# Patient Record
Sex: Female | Born: 1985 | Race: Black or African American | Hispanic: No | Smoking: Never smoker
Health system: Southern US, Community
[De-identification: ages and names within clinical notes are randomized; demographics above are authoritative.]

## PROBLEM LIST (undated history)

## (undated) ENCOUNTER — Inpatient Hospital Stay (HOSPITAL_COMMUNITY): Payer: Self-pay

## (undated) DIAGNOSIS — D649 Anemia, unspecified: Secondary | ICD-10-CM

## (undated) DIAGNOSIS — Z8759 Personal history of other complications of pregnancy, childbirth and the puerperium: Secondary | ICD-10-CM

## (undated) DIAGNOSIS — O009 Unspecified ectopic pregnancy without intrauterine pregnancy: Secondary | ICD-10-CM

## (undated) HISTORY — PX: CYSTECTOMY: SUR359

## (undated) HISTORY — DX: Personal history of other complications of pregnancy, childbirth and the puerperium: Z87.59

## (undated) HISTORY — PX: TONSILLECTOMY: SUR1361

---

## 2016-07-07 ENCOUNTER — Other Ambulatory Visit (HOSPITAL_COMMUNITY): Payer: Self-pay | Admitting: Physician Assistant

## 2016-07-07 DIAGNOSIS — Z3A13 13 weeks gestation of pregnancy: Secondary | ICD-10-CM

## 2016-07-07 DIAGNOSIS — Z3682 Encounter for antenatal screening for nuchal translucency: Secondary | ICD-10-CM

## 2016-07-08 ENCOUNTER — Encounter (HOSPITAL_COMMUNITY): Payer: Self-pay | Admitting: Physician Assistant

## 2016-07-17 ENCOUNTER — Encounter (HOSPITAL_COMMUNITY): Payer: Self-pay | Admitting: *Deleted

## 2016-07-19 ENCOUNTER — Other Ambulatory Visit (HOSPITAL_COMMUNITY): Payer: Self-pay | Admitting: Physician Assistant

## 2016-07-19 DIAGNOSIS — Z3682 Encounter for antenatal screening for nuchal translucency: Secondary | ICD-10-CM

## 2016-07-19 DIAGNOSIS — Z3A13 13 weeks gestation of pregnancy: Secondary | ICD-10-CM

## 2016-07-20 ENCOUNTER — Encounter (HOSPITAL_COMMUNITY): Payer: Self-pay

## 2016-07-20 ENCOUNTER — Ambulatory Visit (HOSPITAL_COMMUNITY): Admission: RE | Admit: 2016-07-20 | Payer: Medicaid Other | Source: Ambulatory Visit

## 2016-07-20 ENCOUNTER — Ambulatory Visit (HOSPITAL_COMMUNITY)
Admission: RE | Admit: 2016-07-20 | Discharge: 2016-07-20 | Disposition: A | Discharge status: Home / Self Care | Payer: Medicaid Other | Source: Ambulatory Visit | Attending: Physician Assistant | Admitting: Physician Assistant

## 2016-07-20 DIAGNOSIS — Z3682 Encounter for antenatal screening for nuchal translucency: Secondary | ICD-10-CM | POA: Diagnosis not present

## 2016-07-20 DIAGNOSIS — Z3A13 13 weeks gestation of pregnancy: Secondary | ICD-10-CM | POA: Insufficient documentation

## 2016-07-20 DIAGNOSIS — Z315 Encounter for genetic counseling: Secondary | ICD-10-CM | POA: Diagnosis present

## 2016-07-20 DIAGNOSIS — Z87898 Personal history of other specified conditions: Secondary | ICD-10-CM | POA: Insufficient documentation

## 2016-07-20 NOTE — Progress Notes (Signed)
Genetic Counseling  High-Risk Gestation Note  Appointment Date:  07/20/2016 Referred By: Ardyth Gal, PA-C Date of Birth:  09-Sep-1986 Partner:  Hubbard Robinson   Pregnancy History: [id number] Estimated Date of Delivery: 01/21/17 Estimated Gestational Age: [redacted]w[redacted]d Attending: Particia Nearing, MD    Mary Cortez and her partner, Mr. Hubbard Robinson, were seen for genetic counseling given family history concerns and to discuss available testing options.   In summary:  Family history reviewed  Patient reported abnormal ultrasound findings were present when her mother was pregnant with her, but the patient had no anomalies at birth  Reviewed that this reported history would not be expected to increased risk for fetal anomalies for the patient's pregnancies  Patient's father and brother have sickle cell trait; patient reported she does not have sickle cell trait  Hemoglobin electrophoresis drawn at patient's OB office and current pending  Reviewed autosomal recessive inheritance of sickle cell trait  If patient does not have sickle cell trait, pregnancy would not be at increased risk for sickle cell anemia  Screening is available to the father of the pregnancy, if desired, if patient identified to have hemoglobin variant  Discussed age related risk for fetal aneuploidy- considered low risk for aneuploidy at age 30 years old  Discussed options for screening  First screen-patient declined today  Quad screen-declined  Ultrasound- NT ultrasound performed today, within normal limits  Discussed carrier screening options  CF-OB records indicate this is pending through [facility] office  SMA-patient declined today  Hemoglobinopathies-OB records indicate this was drawn and is currently pending   Both family histories were reviewed. The patient reported that when her mother was pregnant with her, the patient's father had AFP in his blood. The patient's mother had an ultrasound at approximately  4-5 months gestation which suggested the patient's heart was developing on the outside of the body. Per the patient's mother's report, at a follow-up ultrasound during that pregnancy, the organs were visualized to be in the correct place. The patient reported that she was born without any heart issues and does not have a history of medical concerns.   In general, we reviewed the benefits and limitations of ultrasounds in pregnancy and specifically reviewed limitations of visualization of prenatal ultrasound findings in past decades. We also spent time reviewing that maternal serum AFP is used as a screening tool in pregnancy in maternal blood to assess for potential birth defects but that the measurement in paternal blood would not indicate concerns regarding a pregnancy. Given the report of the family history and the patient's personal reported medical history, the current pregnancy would not be expected to be at increased risk for fetal/congenital anomalies. Additional information regarding this history may alter recurrence risk assessment.    Additionally, Ms. Mary Cortez reported that her father and brother have sickle cell trait. She reported that she does not have sickle cell trait. Medical documentation was not available to confirm this report at this time. OB medical records indicated that hemoglobin electrophoresis was drawn and is currently pending for the patient. Prior to testing for Ms. Mary Cortez, she would have a 1 in 2 (50%) chance of also having sickle cell trait. We discussed that SCA affects the shape and function of the red blood cell by producing abnormal hemoglobin. They were counseled that hemoglobin is a protein in the RBCs that carries oxygen to the body's organs. Individuals who have SCA have a changes within the genes that codes for hemoglobin. This couple was counseled that SCA is  inherited in an autosomal recessive manner, and occurs when both copies of the hemoglobin gene are changed and  produce an abnormal hemoglobin S. Typically, one abnormal gene for the production of hemoglobin S is inherited from each parent. A carrier of SCA has one altered copy of the gene for hemoglobin and one typical working copy. Carriers of recessive conditions typically do not have symptoms related to the condition because they still have one functioning copy of the gene, and thus some production of the typical protein coded for by that gene.   OB medical records indicate that hemoglobin electrophoresis [facility], and results are currently pending. Given the patient's report, the pregnancy would not be at increased risk for sickle cell anemia. However, we discussed that if the patient is determined to have a hemoglobin variant via hemoglobin electrophoresis, screening would also be available to the father of the pregnancy. Prenatal diagnosis via amniocentesis would be available in a pregnancy where both parents were identified to be carriers of hemoglobinopathies.  We also reviewed the availability of newborn screening in [place] for hemoglobinopathies.    Without further information regarding the provided family history, an accurate genetic risk cannot be calculated. Further genetic counseling is warranted if more information is obtained.  We then discussed routine screening options. They were counseled regarding ACOG recommended screening for cystic fibrosis, spinal muscular atrophy and hemoglobinopathies.  We reviewed the autosomal recessive patterns of inheritance of these conditions, the carrier frequencies, and the availability of prenatal diagnosis if indicated. We discussed the cost of the testing, and the next steps if she were to be identified to be a carrier. In addition to hemoglobinopathies, OB medical records indicated that cystic fibrosis carrier screening was also performed and is currently pending.  She declined SMA carrier screening today.  This  couple was counseled regarding the availability of screening for fetal aneuploidy. Considering Ms. Dior Jackson's maternal age of 30 y.o. and her negative family history, we discussed that she is considered to be in the low risk category for having a fetus with aneuploidy. We reviewed chromosomes, nondisjunction, and the associated risk for fetal aneuploidy related to a maternal age of 30 y.o. at 443w4d gestation. They were counseled that the risk for aneuploidy decreases as gestational age increases, accounting for those pregnancies which spontaneously abort. We briefly discussed common fetal chromosome conditions including the features and prognoses of each.   We reviewed available routine screening options including First Screen, Quad screen, nuchal translucency ultrasound and detailed ultrasound. They were counseled that screening tests are used to modify a patient's a priori risk for aneuploidy, typically based on age. This estimate provides a pregnancy specific risk assessment. We reviewed the benefits and limitations of each option. Specifically, we discussed the conditions for which each test screens, the detection rates, and false positive rates of each. They were also briefly counseled regarding noninvasive prenatal screening (NIPS)/cell free DNA (cfDNA) screening and diagnostic testing via CVS or amniocentesis. We discussed that while NIPS and diagnostic testing should be made available to all pregnant patients, ACOG recommends that this testing be offered to those patients who are considered to have a "high" risk for fetal aneuploidy (i.e. those who are AMA, have an abnormal maternal serum or combined screening result, have an abnormal finding by fetal ultrasound, or have a family history of a specific chromosome condition). We discussed the possible results that the tests might provide including: positive, negative, unanticipated, and no result. Finally, they  were counseled regarding the cost of each  option and potential out of pocket expenses.   After consideration of all the options, they elected to have a nuchal translucency ultrasound, which was performed today. The report will be documented separately. Detailed ultrasound is available in the second trimester to the patient. They understand that screening tests cannot rule out all birth defects or genetic syndromes. The patient was advised of this limitation and states she still does not want additional testing at this time. First trimester screening and other specific screening for fetal aneuploidy were declined today.  I counseled this couple regarding the above risks and available options. The approximate face-to-face time with the genetic counselor was 35 minutes.  Quinn Plowman, MS,  Certified Genetic Counselor 07/20/2016

## 2016-08-06 ENCOUNTER — Inpatient Hospital Stay (HOSPITAL_COMMUNITY)
Admission: AD | Admit: 2016-08-06 | Discharge: 2016-08-06 | Disposition: A | Discharge status: Home / Self Care | Payer: Medicaid Other | Source: Ambulatory Visit | Attending: Obstetrics & Gynecology | Admitting: Obstetrics & Gynecology

## 2016-08-06 ENCOUNTER — Encounter (HOSPITAL_COMMUNITY): Payer: Self-pay | Admitting: *Deleted

## 2016-08-06 DIAGNOSIS — N898 Other specified noninflammatory disorders of vagina: Secondary | ICD-10-CM | POA: Diagnosis not present

## 2016-08-06 DIAGNOSIS — O26892 Other specified pregnancy related conditions, second trimester: Secondary | ICD-10-CM | POA: Insufficient documentation

## 2016-08-06 DIAGNOSIS — Z3A16 16 weeks gestation of pregnancy: Secondary | ICD-10-CM | POA: Insufficient documentation

## 2016-08-06 HISTORY — DX: Unspecified ectopic pregnancy without intrauterine pregnancy: O00.90

## 2016-08-06 LAB — WET PREP, GENITAL
Sperm: NONE SEEN
Trich, Wet Prep: NONE SEEN
Yeast Wet Prep HPF POC: NONE SEEN

## 2016-08-06 NOTE — MAU Provider Note (Signed)
Chief Complaint: Vaginal Discharge   First Provider Initiated Contact with Patient 08/06/16 2108      SUBJECTIVE HPI: Mary Cortez is a 30 y.o. G3P0020 at 3222w0d by LMP pt of [facility] who presents to maternity admissions reporting she had a single spot of pink, the size of a piece of oatmeal, when wiping tonight.  She called the nurse call line and was told to watch and if bleeding became heavier to come in but she was worried so came to MAU anyway. [place] and has prenatal care scheduled to start there right way.  She denies abdominal pain.  She has not tried any treatments, nothing makes her symptom better or worse. It is new onset, and resolved spontaneously. She denies vaginal itching/burning, urinary symptoms, h/a, dizziness, n/v, or fever/chills.     HPI  Past Medical History:  Diagnosis Date  . Ectopic pregnancy   . Medical history non-contributory    Past Surgical History:  Procedure Laterality Date  . CYSTECTOMY    . TONSILLECTOMY     Social History   Social History  . Marital status: Single    Spouse name: N/A  . Number of children: N/A  . Years of education: N/A   Occupational History  . Not on file.   Social History Main Topics  . Smoking status: Never Smoker  . Smokeless tobacco: Never Used  . Alcohol use No  . Drug use: No  . Sexual activity: Yes    Birth control/ protection: None   Other Topics Concern  . Not on file   Social History Narrative  . No narrative on file   No current facility-administered medications on file prior to encounter.    No current outpatient prescriptions on file prior to encounter.   No Known Allergies  ROS:  Review of Systems  Constitutional: Negative for chills, fatigue and fever.  Respiratory: Negative for shortness of breath.   Cardiovascular: Negative for chest pain.  Gastrointestinal: Negative for nausea and vomiting.  Genitourinary: Positive for vaginal discharge. Negative for difficulty  urinating, dysuria, flank pain, pelvic pain, vaginal bleeding and vaginal pain.  Neurological: Negative for dizziness and headaches.  Psychiatric/Behavioral: Negative.      I have reviewed patient's Past Medical Hx, Surgical Hx, Family Hx, Social Hx, medications and allergies.   Physical Exam   Patient Vitals for the past 24 hrs:  BP Temp Pulse Resp Height Weight  08/06/16 2013 - 98.8 F (37.1 C) - - - -  08/06/16 1901 122/79 99.5 F (37.5 C) 99 18 5\' 3"  (1.6 m) 189 lb (85.7 kg)   Constitutional: Well-developed, well-nourished female in no acute distress.  Cardiovascular: normal rate Respiratory: normal effort GI: Abd soft, non-tender. Pos BS x 4 MS: Extremities nontender, no edema, normal ROM Neurologic: Alert and oriented x 4.  GU: Neg CVAT.  PELVIC EXAM: Cervix pink, visually closed, without lesion, scant white creamy discharge, vaginal walls and external genitalia normal Bimanual exam: Cervix 0/long/high, firm, anterior, neg CMT, uterus nontender, nonenlarged, adnexa without tenderness, enlargement, or mass  FHT 161 by doppler  LAB RESULTS No results found for this or any previous visit (from the past 24 hour(s)). Wet prep and GCC pending    IMAGING Bedside US reveals viable IUP with FHR 155, subjectively normal fluid level, posterior placenta, above os  MAU Management/MDM: Ordered labs and reviewed results.  FHR by doppler and bedside US confirm FHR.  No evidence of bleeding on exam today.  Pt to  f/u with her transfer of prenatal care to [place] as planned.  Return to MAU as needed for emergencies.  Pt stable at time of discharge.  ASSESSMENT 1. Vaginal discharge during pregnancy in second trimester     PLAN Discharge home with second trimester precautions   Medication List    TAKE these medications   prenatal multivitamin Tabs tablet Take 1 tablet by mouth daily.      Follow-up Information    Prenatal care provider of your choice .   Why:  Make  appointment as soon as possible with prenatal care provider in [place] after your move.  Return to MAU as needed for emergencies.          Sharen Counter Certified Nurse-Midwife 08/06/2016  9:50 PM

## 2016-08-06 NOTE — MAU Note (Signed)
Pt stated when she wiped after going to the bathroom she has small piece of "tissue" come out that was pink. No bleeding or discharge and no pain or cramping.

## 2016-08-06 NOTE — Discharge Instructions (Signed)
Vaginal Bleeding During Pregnancy, Second Trimester A small amount of bleeding (spotting) from the vagina is relatively common in pregnancy. It usually stops on its own. Various things can cause bleeding or spotting in pregnancy. Some bleeding may be related to the pregnancy, and some may not. Sometimes the bleeding is normal and is not a problem. However, bleeding can also be a sign of something serious. Be sure to tell your health care provider about any vaginal bleeding right away. Some possible causes of vaginal bleeding during the second trimester include:  Infection, inflammation, or growths on the cervix.   The placenta may be partially or completely covering the opening of the cervix inside the uterus (placenta previa).  The placenta may have separated from the uterus (abruption of the placenta).   You may be having early (preterm) labor.   The cervix may not be strong enough to keep a baby inside the uterus (cervical insufficiency).   Tiny cysts may have developed in the uterus instead of pregnancy tissue (molar pregnancy). HOME CARE INSTRUCTIONS  Watch your condition for any changes. The following actions may help to lessen any discomfort you are feeling:  Follow your health care provider's instructions for limiting your activity. If your health care provider orders bed rest, you may need to stay in bed and only get up to use the bathroom. However, your health care provider may allow you to continue light activity.  If needed, make plans for someone to help with your regular activities and responsibilities while you are on bed rest.  Keep track of the number of pads you use each day, how often you change pads, and how soaked (saturated) they are. Write this down.  Do not use tampons. Do not douche.  Do not have sexual intercourse or orgasms until approved by your health care provider.  If you pass any tissue from your vagina, save the tissue so you can show it to your  health care provider.  Only take over-the-counter or prescription medicines as directed by your health care provider.  Do not take aspirin because it can make you bleed.  Do not exercise or perform any strenuous activities or heavy lifting without your health care provider's permission.  Keep all follow-up appointments as directed by your health care provider. SEEK MEDICAL CARE IF:  You have any vaginal bleeding during any part of your pregnancy.  You have cramps or labor pains.  You have a fever, not controlled by medicine. SEEK IMMEDIATE MEDICAL CARE IF:   You have severe cramps in your back or belly (abdomen).  You have contractions.  You have chills.  You pass large clots or tissue from your vagina.  Your bleeding increases.  You feel light-headed or weak, or you have fainting episodes.  You are leaking fluid or have a gush of fluid from your vagina. MAKE SURE YOU:  Understand these instructions.  Will watch your condition.  Will get help right away if you are not doing well or get worse.   This information is not intended to replace advice given to you by your health care provider. Make sure you discuss any questions you have with your health care provider.   Document Released: 07/15/2005 Document Revised: 10/10/2013 Document Reviewed: 06/12/2013 Elsevier Interactive Patient Education 2016 [employer]  

## 2016-08-07 LAB — GC/CHLAMYDIA PROBE AMP (~~LOC~~) NOT AT ARMC
Chlamydia: NEGATIVE
Neisseria Gonorrhea: NEGATIVE

## 2016-08-17 ENCOUNTER — Ambulatory Visit (HOSPITAL_COMMUNITY): Payer: Medicaid Other

## 2017-05-22 ENCOUNTER — Encounter (HOSPITAL_COMMUNITY): Payer: Self-pay

## 2018-12-23 ENCOUNTER — Other Ambulatory Visit: Payer: Self-pay

## 2018-12-23 ENCOUNTER — Inpatient Hospital Stay (HOSPITAL_COMMUNITY): Payer: BLUE CROSS/BLUE SHIELD | Admitting: Anesthesiology

## 2018-12-23 ENCOUNTER — Inpatient Hospital Stay (HOSPITAL_COMMUNITY): Payer: BLUE CROSS/BLUE SHIELD

## 2018-12-23 ENCOUNTER — Encounter (HOSPITAL_COMMUNITY): Payer: Self-pay | Admitting: *Deleted

## 2018-12-23 ENCOUNTER — Encounter (HOSPITAL_COMMUNITY)
Admission: AD | Disposition: A | Discharge status: Home / Self Care | Payer: Self-pay | Source: Home / Self Care | Attending: Obstetrics and Gynecology

## 2018-12-23 ENCOUNTER — Observation Stay (HOSPITAL_COMMUNITY)
Admission: AD | Admit: 2018-12-23 | Discharge: 2018-12-25 | Disposition: A | Discharge status: Home / Self Care | Payer: BLUE CROSS/BLUE SHIELD | Attending: Obstetrics and Gynecology | Admitting: Obstetrics and Gynecology

## 2018-12-23 DIAGNOSIS — O008 Other ectopic pregnancy without intrauterine pregnancy: Secondary | ICD-10-CM | POA: Diagnosis present

## 2018-12-23 DIAGNOSIS — O021 Missed abortion: Secondary | ICD-10-CM

## 2018-12-23 DIAGNOSIS — Z419 Encounter for procedure for purposes other than remedying health state, unspecified: Secondary | ICD-10-CM

## 2018-12-23 DIAGNOSIS — Z8759 Personal history of other complications of pregnancy, childbirth and the puerperium: Secondary | ICD-10-CM

## 2018-12-23 DIAGNOSIS — Z9889 Other specified postprocedural states: Secondary | ICD-10-CM

## 2018-12-23 DIAGNOSIS — O209 Hemorrhage in early pregnancy, unspecified: Secondary | ICD-10-CM

## 2018-12-23 DIAGNOSIS — Z3A08 8 weeks gestation of pregnancy: Secondary | ICD-10-CM | POA: Insufficient documentation

## 2018-12-23 HISTORY — PX: LAPAROTOMY: SHX154

## 2018-12-23 HISTORY — DX: Anemia, unspecified: D64.9

## 2018-12-23 HISTORY — DX: Personal history of other complications of pregnancy, childbirth and the puerperium: Z87.59

## 2018-12-23 HISTORY — PX: DIAGNOSTIC LAPAROSCOPY WITH REMOVAL OF ECTOPIC PREGNANCY: SHX6449

## 2018-12-23 LAB — CBC
HCT: 37.6 % (ref 36.0–46.0)
HCT: 34.3 % — ABNORMAL LOW (ref 36.0–46.0)
Hemoglobin: 12 g/dL (ref 12.0–15.0)
Hemoglobin: 11.6 g/dL — ABNORMAL LOW (ref 12.0–15.0)
MCH: 24.1 pg — ABNORMAL LOW (ref 26.0–34.0)
MCH: 26.7 pg (ref 26.0–34.0)
MCHC: 31.9 g/dL (ref 30.0–36.0)
MCHC: 33.8 g/dL (ref 30.0–36.0)
MCV: 75.5 fL — ABNORMAL LOW (ref 80.0–100.0)
MCV: 79 fL — ABNORMAL LOW (ref 80.0–100.0)
Platelets: 238 10*3/uL (ref 150–400)
Platelets: 215 10*3/uL (ref 150–400)
RBC: 4.98 MIL/uL (ref 3.87–5.11)
RBC: 4.34 MIL/uL (ref 3.87–5.11)
RDW: 14.5 % (ref 11.5–15.5)
RDW: 15.1 % (ref 11.5–15.5)
WBC: 4.9 10*3/uL (ref 4.0–10.5)
WBC: 17.1 10*3/uL — ABNORMAL HIGH (ref 4.0–10.5)
nRBC: 0 % (ref 0.0–0.2)
nRBC: 0 % (ref 0.0–0.2)

## 2018-12-23 LAB — URINALYSIS, ROUTINE W REFLEX MICROSCOPIC
Bilirubin Urine: NEGATIVE
Glucose, UA: NEGATIVE mg/dL
Ketones, ur: 5 mg/dL — AB
Leukocytes,Ua: NEGATIVE
Nitrite: NEGATIVE
Protein, ur: NEGATIVE mg/dL
RBC / HPF: >50 RBC/hpf — ABNORMAL HIGH (ref 0–5)
Specific Gravity, Urine: 1.025 (ref 1.005–1.030)
pH: 6 (ref 5.0–8.0)

## 2018-12-23 LAB — WET PREP, GENITAL
Clue Cells Wet Prep HPF POC: NONE SEEN
Sperm: NONE SEEN
Trich, Wet Prep: NONE SEEN
Yeast Wet Prep HPF POC: NONE SEEN

## 2018-12-23 LAB — PREPARE RBC (CROSSMATCH)

## 2018-12-23 LAB — POCT PREGNANCY, URINE: Preg Test, Ur: POSITIVE — AB

## 2018-12-23 LAB — HCG, QUANTITATIVE, PREGNANCY: hCG, Beta Chain, Quant, S: 23812 m[IU]/mL — ABNORMAL HIGH (ref <5)

## 2018-12-23 SURGERY — LAPAROSCOPY, WITH ECTOPIC PREGNANCY SURGICAL TREATMENT
Anesthesia: General | Site: Abdomen

## 2018-12-23 MED ORDER — DIPHENHYDRAMINE HCL 50 MG/ML IJ SOLN: 12.5000 mg | Freq: Four times a day (QID) | INTRAMUSCULAR | Status: DC | PRN

## 2018-12-23 MED ORDER — SODIUM CHLORIDE (PF) 0.9 % IJ SOLN
INTRAMUSCULAR | Status: AC
  Filled 2018-12-23: qty 100

## 2018-12-23 MED ORDER — ONDANSETRON HCL 4 MG/2ML IJ SOLN
INTRAMUSCULAR | Status: AC
  Filled 2018-12-23: qty 4

## 2018-12-23 MED ORDER — LACTATED RINGERS IV SOLN
INTRAVENOUS | Status: DC
  Administered 2018-12-23 (×3): via INTRAVENOUS

## 2018-12-23 MED ORDER — BUPIVACAINE HCL 0.5 % IJ SOLN
INTRAMUSCULAR | Status: AC
  Filled 2018-12-23: qty 1

## 2018-12-23 MED ORDER — POLYETHYLENE GLYCOL 3350 17 G PO PACK
17.0000 g | PACK | Freq: Every day | ORAL | Status: DC
  Administered 2018-12-24: 17 g via ORAL
  Filled 2018-12-23: qty 1

## 2018-12-23 MED ORDER — CALCIUM CHLORIDE 10 % IV SOLN
INTRAVENOUS | Status: DC | PRN
  Administered 2018-12-23 (×5): 200 mg via INTRAVENOUS

## 2018-12-23 MED ORDER — NALOXONE HCL 0.4 MG/ML IJ SOLN: 0.4000 mg | INTRAMUSCULAR | Status: DC | PRN

## 2018-12-23 MED ORDER — BUPIVACAINE HCL 0.5 % IJ SOLN
INTRAMUSCULAR | Status: DC | PRN
  Administered 2018-12-23: 9 mL

## 2018-12-23 MED ORDER — LACTATED RINGERS IV SOLN
INTRAVENOUS | Status: DC
  Administered 2018-12-23: 22:00:00 via INTRAVENOUS

## 2018-12-23 MED ORDER — LIDOCAINE 2% (20 MG/ML) 5 ML SYRINGE
INTRAMUSCULAR | Status: DC | PRN
  Administered 2018-12-23: 60 mg via INTRAVENOUS

## 2018-12-23 MED ORDER — SODIUM CHLORIDE 0.9 % IV SOLN
INTRAVENOUS | Status: DC | PRN
  Administered 2018-12-23: 17:00:00 via INTRAVENOUS

## 2018-12-23 MED ORDER — ACETAMINOPHEN 10 MG/ML IV SOLN
INTRAVENOUS | Status: AC
  Filled 2018-12-23: qty 100

## 2018-12-23 MED ORDER — MIDAZOLAM HCL 2 MG/2ML IJ SOLN
INTRAMUSCULAR | Status: AC
  Filled 2018-12-23: qty 2

## 2018-12-23 MED ORDER — SILVER NITRATE-POT NITRATE 75-25 % EX MISC
CUTANEOUS | Status: DC | PRN
  Administered 2018-12-23: 4

## 2018-12-23 MED ORDER — ACETAMINOPHEN 10 MG/ML IV SOLN
INTRAVENOUS | Status: DC | PRN
  Administered 2018-12-23: 1000 mg via INTRAVENOUS

## 2018-12-23 MED ORDER — SODIUM CHLORIDE 0.9% IV SOLUTION: Freq: Once | INTRAVENOUS | Status: DC

## 2018-12-23 MED ORDER — SODIUM CHLORIDE 0.9 % IV SOLN: 10.0000 mL/h | Freq: Once | INTRAVENOUS | Status: DC

## 2018-12-23 MED ORDER — ROCURONIUM BROMIDE 50 MG/5ML IV SOSY
PREFILLED_SYRINGE | INTRAVENOUS | Status: AC
  Filled 2018-12-23: qty 10

## 2018-12-23 MED ORDER — SODIUM CHLORIDE 0.9% FLUSH: 9.0000 mL | INTRAVENOUS | Status: DC | PRN

## 2018-12-23 MED ORDER — RHO D IMMUNE GLOBULIN 1500 UNIT/2ML IJ SOSY
300.0000 ug | PREFILLED_SYRINGE | Freq: Once | INTRAMUSCULAR | Status: AC
  Administered 2018-12-23: 300 ug via INTRAMUSCULAR
  Filled 2018-12-23: qty 2

## 2018-12-23 MED ORDER — SUGAMMADEX SODIUM 200 MG/2ML IV SOLN
INTRAVENOUS | Status: DC | PRN
  Administered 2018-12-23: 191 mg via INTRAVENOUS

## 2018-12-23 MED ORDER — ROCURONIUM BROMIDE 50 MG/5ML IV SOSY
PREFILLED_SYRINGE | INTRAVENOUS | Status: DC | PRN
  Administered 2018-12-23: 10 mg via INTRAVENOUS
  Administered 2018-12-23: 20 mg via INTRAVENOUS
  Administered 2018-12-23: 30 mg via INTRAVENOUS
  Administered 2018-12-23: 10 mg via INTRAVENOUS

## 2018-12-23 MED ORDER — ONDANSETRON HCL 4 MG PO TABS: 4.0000 mg | ORAL_TABLET | Freq: Four times a day (QID) | ORAL | Status: DC | PRN

## 2018-12-23 MED ORDER — PROPOFOL 10 MG/ML IV BOLUS
INTRAVENOUS | Status: DC | PRN
  Administered 2018-12-23: 130 mg via INTRAVENOUS

## 2018-12-23 MED ORDER — SUCCINYLCHOLINE CHLORIDE 200 MG/10ML IV SOSY
PREFILLED_SYRINGE | INTRAVENOUS | Status: DC | PRN
  Administered 2018-12-23: 100 mg via INTRAVENOUS

## 2018-12-23 MED ORDER — SIMETHICONE 80 MG PO CHEW
80.0000 mg | CHEWABLE_TABLET | Freq: Four times a day (QID) | ORAL | Status: DC
  Administered 2018-12-24 (×2): 80 mg via ORAL
  Filled 2018-12-23 (×5): qty 1

## 2018-12-23 MED ORDER — VASOPRESSIN 20 UNIT/ML IV SOLN
INTRAVENOUS | Status: AC
  Filled 2018-12-23: qty 1

## 2018-12-23 MED ORDER — FENTANYL CITRATE (PF) 250 MCG/5ML IJ SOLN
INTRAMUSCULAR | Status: DC | PRN
  Administered 2018-12-23 (×2): 50 ug via INTRAVENOUS
  Administered 2018-12-23 (×4): 100 ug via INTRAVENOUS

## 2018-12-23 MED ORDER — DEXAMETHASONE SODIUM PHOSPHATE 10 MG/ML IJ SOLN
INTRAMUSCULAR | Status: DC | PRN
  Administered 2018-12-23: 10 mg via INTRAVENOUS

## 2018-12-23 MED ORDER — CALCIUM CHLORIDE 10 % IV SOLN
INTRAVENOUS | Status: AC
  Filled 2018-12-23: qty 10

## 2018-12-23 MED ORDER — DEXAMETHASONE SODIUM PHOSPHATE 10 MG/ML IJ SOLN
INTRAMUSCULAR | Status: AC
  Filled 2018-12-23: qty 1

## 2018-12-23 MED ORDER — HYDROMORPHONE HCL 1 MG/ML IJ SOLN
INTRAMUSCULAR | Status: AC
  Filled 2018-12-23: qty 2

## 2018-12-23 MED ORDER — HYDROMORPHONE HCL 1 MG/ML IJ SOLN
INTRAMUSCULAR | Status: AC
  Filled 2018-12-23: qty 0.5

## 2018-12-23 MED ORDER — SODIUM CHLORIDE 0.9 % IV SOLN
INTRAVENOUS | Status: DC | PRN
  Administered 2018-12-23: 100 ug/min via INTRAVENOUS

## 2018-12-23 MED ORDER — PANTOPRAZOLE SODIUM 40 MG IV SOLR: 40.0000 mg | Freq: Every day | INTRAVENOUS | Status: DC

## 2018-12-23 MED ORDER — DIPHENHYDRAMINE HCL 12.5 MG/5ML PO ELIX
12.5000 mg | ORAL_SOLUTION | Freq: Four times a day (QID) | ORAL | Status: DC | PRN
  Filled 2018-12-23: qty 10

## 2018-12-23 MED ORDER — MIDAZOLAM HCL 5 MG/5ML IJ SOLN
INTRAMUSCULAR | Status: DC | PRN
  Administered 2018-12-23 (×2): 2 mg via INTRAVENOUS

## 2018-12-23 MED ORDER — PHENYLEPHRINE 40 MCG/ML (10ML) SYRINGE FOR IV PUSH (FOR BLOOD PRESSURE SUPPORT)
PREFILLED_SYRINGE | INTRAVENOUS | Status: DC | PRN
  Administered 2018-12-23: 80 ug via INTRAVENOUS
  Administered 2018-12-23 (×2): 120 ug via INTRAVENOUS
  Administered 2018-12-23: 80 ug via INTRAVENOUS

## 2018-12-23 MED ORDER — FENTANYL CITRATE (PF) 250 MCG/5ML IJ SOLN
INTRAMUSCULAR | Status: AC
  Filled 2018-12-23: qty 5

## 2018-12-23 MED ORDER — MENTHOL 3 MG MT LOZG
1.0000 | LOZENGE | OROMUCOSAL | Status: DC | PRN
  Administered 2018-12-24: 3 mg via ORAL
  Filled 2018-12-23 (×2): qty 9

## 2018-12-23 MED ORDER — ALBUMIN HUMAN 5 % IV SOLN
INTRAVENOUS | Status: DC | PRN
  Administered 2018-12-23 (×2): via INTRAVENOUS

## 2018-12-23 MED ORDER — HYDROMORPHONE HCL 1 MG/ML IJ SOLN
0.2500 mg | INTRAMUSCULAR | Status: DC | PRN
  Administered 2018-12-23 (×4): 0.5 mg via INTRAVENOUS

## 2018-12-23 MED ORDER — ONDANSETRON HCL 4 MG/2ML IJ SOLN: 4.0000 mg | Freq: Four times a day (QID) | INTRAMUSCULAR | Status: DC | PRN

## 2018-12-23 MED ORDER — LIDOCAINE 2% (20 MG/ML) 5 ML SYRINGE
INTRAMUSCULAR | Status: AC
  Filled 2018-12-23: qty 5

## 2018-12-23 MED ORDER — HYDROMORPHONE HCL 1 MG/ML IJ SOLN
INTRAMUSCULAR | Status: DC | PRN
  Administered 2018-12-23: 0.5 mg via INTRAVENOUS

## 2018-12-23 MED ORDER — PROPOFOL 10 MG/ML IV BOLUS
INTRAVENOUS | Status: AC
  Filled 2018-12-23: qty 40

## 2018-12-23 MED ORDER — ONDANSETRON HCL 4 MG/2ML IJ SOLN
INTRAMUSCULAR | Status: DC | PRN
  Administered 2018-12-23 (×2): 4 mg via INTRAVENOUS

## 2018-12-23 MED ORDER — SILVER NITRATE-POT NITRATE 75-25 % EX MISC
CUTANEOUS | Status: AC
  Filled 2018-12-23: qty 1

## 2018-12-23 MED ORDER — HYDROMORPHONE 1 MG/ML IV SOLN
INTRAVENOUS | Status: DC
  Administered 2018-12-23: 25 mg via INTRAVENOUS
  Administered 2018-12-24: 0.9 mg via INTRAVENOUS
  Filled 2018-12-23: qty 25

## 2018-12-23 SURGICAL SUPPLY — 58 items
APPLICATOR COTTON TIP 6 STRL (MISCELLANEOUS) ×1 IMPLANT
APPLICATOR COTTON TIP 6IN STRL (MISCELLANEOUS) ×2
BARRIER ADHS 3X4 INTERCEED (GAUZE/BANDAGES/DRESSINGS) ×2 IMPLANT
BENZOIN TINCTURE PRP APPL 2/3 (GAUZE/BANDAGES/DRESSINGS) ×2 IMPLANT
BLADE SURG 15 STRL LF DISP TIS (BLADE) ×1 IMPLANT
BLADE SURG 15 STRL SS (BLADE) ×1
CABLE HIGH FREQUENCY MONO STRZ (ELECTRODE) ×2 IMPLANT
DEFOGGER SCOPE WARMER CLEARIFY (MISCELLANEOUS) ×2 IMPLANT
DERMABOND ADVANCED (GAUZE/BANDAGES/DRESSINGS) ×2
DERMABOND ADVANCED .7 DNX12 (GAUZE/BANDAGES/DRESSINGS) ×2 IMPLANT
DRSG OPSITE POSTOP 3X4 (GAUZE/BANDAGES/DRESSINGS) ×2 IMPLANT
DRSG OPSITE POSTOP 4X10 (GAUZE/BANDAGES/DRESSINGS) ×2 IMPLANT
DURAPREP 26ML APPLICATOR (WOUND CARE) ×2 IMPLANT
ELECT REM PT RETURN 9FT ADLT (ELECTROSURGICAL) ×2
ELECTRODE REM PT RTRN 9FT ADLT (ELECTROSURGICAL) ×1 IMPLANT
GLOVE BIOGEL PI IND STRL 7.0 (GLOVE) ×2 IMPLANT
GLOVE BIOGEL PI IND STRL 7.5 (GLOVE) ×2 IMPLANT
GLOVE BIOGEL PI INDICATOR 7.0 (GLOVE) ×2
GLOVE BIOGEL PI INDICATOR 7.5 (GLOVE) ×2
GLOVE SURG SS PI 7.0 STRL IVOR (GLOVE) ×2 IMPLANT
GOWN STRL REUS W/ TWL LRG LVL3 (GOWN DISPOSABLE) ×3 IMPLANT
GOWN STRL REUS W/TWL LRG LVL3 (GOWN DISPOSABLE) ×3
KIT TURNOVER KIT B (KITS) ×2 IMPLANT
LIGASURE VESSEL 5MM BLUNT TIP (ELECTROSURGICAL) IMPLANT
NEEDLE EPID 17G 5 ECHO TUOHY (NEEDLE) ×2 IMPLANT
NS IRRIG 1000ML POUR BTL (IV SOLUTION) ×2 IMPLANT
PACK LAPAROSCOPY BASIN (CUSTOM PROCEDURE TRAY) ×2 IMPLANT
PACK TRENDGUARD 450 HYBRID PRO (MISCELLANEOUS) ×1 IMPLANT
PAD OB MATERNITY 4.3X12.25 (PERSONAL CARE ITEMS) ×2 IMPLANT
PENCIL BUTTON HOLSTER BLD 10FT (ELECTRODE) ×2 IMPLANT
POUCH LAPAROSCOPIC INSTRUMENT (MISCELLANEOUS) ×2 IMPLANT
POUCH SPECIMEN RETRIEVAL 10MM (ENDOMECHANICALS) ×2 IMPLANT
PROTECTOR NERVE ULNAR (MISCELLANEOUS) ×4 IMPLANT
RETRACTOR WND ALEXIS 25 LRG (MISCELLANEOUS) ×1 IMPLANT
RTRCTR WOUND ALEXIS 25CM LRG (MISCELLANEOUS) ×2
SCISSORS LAP 5X35 DISP (ENDOMECHANICALS) ×2 IMPLANT
SET IRRIG TUBING LAPAROSCOPIC (IRRIGATION / IRRIGATOR) ×2 IMPLANT
SET TUBE SMOKE EVAC HIGH FLOW (TUBING) ×2 IMPLANT
SLEEVE ADV FIXATION 5X100MM (TROCAR) IMPLANT
SLEEVE ENDOPATH XCEL 5M (ENDOMECHANICALS) IMPLANT
SPONGE LAP 18X18 RF (DISPOSABLE) ×8 IMPLANT
STRIP CLOSURE SKIN 1/2X4 (GAUZE/BANDAGES/DRESSINGS) ×2 IMPLANT
SUCTION POOLE TIP (SUCTIONS) ×2 IMPLANT
SUT MNCRL AB 0 CT1 27 (SUTURE) ×2 IMPLANT
SUT MON AB 2-0 CT1 27 (SUTURE) ×4 IMPLANT
SUT MON AB 2-0 CT1 36 (SUTURE) ×4 IMPLANT
SUT MON AB 4-0 PS1 27 (SUTURE) ×2 IMPLANT
SUT VIC AB 2-0 CT1 27 (SUTURE) ×1
SUT VIC AB 2-0 CT1 TAPERPNT 27 (SUTURE) ×1 IMPLANT
SUT VICRYL 0 UR6 27IN ABS (SUTURE) ×2 IMPLANT
SYR 10ML LL (SYRINGE) ×2 IMPLANT
TOWEL OR 17X24 6PK STRL BLUE (TOWEL DISPOSABLE) ×4 IMPLANT
TRAY FOLEY W/BAG SLVR 14FR (SET/KITS/TRAYS/PACK) ×2 IMPLANT
TRENDGUARD 450 HYBRID PRO PACK (MISCELLANEOUS) ×2
TROCAR ADV FIXATION 5X100MM (TROCAR) ×2 IMPLANT
TROCAR BALLN 12MMX100 BLUNT (TROCAR) ×2 IMPLANT
TUBE CONNECTING 12X1/4 (SUCTIONS) ×2 IMPLANT
YANKAUER SUCT BULB TIP NO VENT (SUCTIONS) ×2 IMPLANT

## 2018-12-23 NOTE — Anesthesia Postprocedure Evaluation (Incomplete)
Anesthesia Post Note  Patient: Mary Cortez  Procedure(s) Performed: DIAGNOSTIC LAPAROSCOPY WITH REMOVAL OF ECTOPIC PREGNANCY (N/A Abdomen) Exploratory Laparotomy for removal of right corneal ectopic pregnancy (N/A Abdomen)     Anesthesia Post Evaluation  Last Vitals:  Vitals:   12/23/18 0947 12/23/18 1445  BP: 134/82 118/72  Pulse: (!) 101 (!) 103  Resp: 17 18  Temp: 37.1 C 37.3 C  SpO2: 100% 100%    Last Pain:  Vitals:   12/23/18 1445  TempSrc: Axillary  PainSc:                  Dairl Ponder

## 2018-12-23 NOTE — MAU Note (Addendum)
Has been spotting off and on for 11 days.  Has been nauseated.  +HPT yesterday, faintly positive.  Hx of ectopic.

## 2018-12-23 NOTE — Anesthesia Procedure Notes (Signed)
Procedure Name: Intubation Date/Time: 12/23/2018 4:14 PM Performed by: Adria Dill, CRNA Pre-anesthesia Checklist: Patient identified, Emergency Drugs available, Suction available and Patient being monitored Patient Re-evaluated:Patient Re-evaluated prior to induction Oxygen Delivery Method: Circle system utilized Preoxygenation: Pre-oxygenation with 100% oxygen Induction Type: IV induction, Rapid sequence and Cricoid Pressure applied Laryngoscope Size: Miller and 2 Grade View: Grade I Tube type: Oral Airway Equipment and Method: Stylet Placement Confirmation: ETT inserted through vocal cords under direct vision,  positive ETCO2 and breath sounds checked- equal and bilateral Secured at: 21 cm Tube secured with: Tape Dental Injury: Teeth and Oropharynx as per pre-operative assessment

## 2018-12-23 NOTE — MAU Provider Note (Signed)
Chief Complaint: Possible Pregnancy; Nausea; Vaginal Bleeding; and Abdominal Pain   First Provider Initiated Contact with Patient 12/23/18 1028      SUBJECTIVE HPI: Mary Cortez is a 33 y.o. G4P1020 at [redacted]w[redacted]d by LMP who presents to maternity admissions reporting spotting off and on x 11 days with positive HPT yesterday.  LMP was January 14 with hx of regular periods.  Pt has hx of ectopic pregnancy so wanted to be evaluated. The bleeding is light, pink and needs a pantyliner only. There is some nausea daily but none now. There are no other symptoms. She has not tried any treatments.  She denies vaginal bleeding, vaginal itching/burning, urinary symptoms, h/a, dizziness, n/v, or fever/chills.     HPI  Past Medical History:  Diagnosis Date  . Anemia   . Ectopic pregnancy   . Medical history non-contributory    Past Surgical History:  Procedure Laterality Date  . CESAREAN SECTION    . CYSTECTOMY    . TONSILLECTOMY     Social History   Socioeconomic History  . Marital status: Significant Other    Spouse name: Not on file  . Number of children: Not on file  . Years of education: Not on file  . Highest education level: Not on file  Occupational History  . Not on file  Social Needs  . Financial resource strain: Not on file  . Food insecurity:    Worry: Not on file    Inability: Not on file  . Transportation needs:    Medical: Not on file    Non-medical: Not on file  Tobacco Use  . Smoking status: Never Smoker  . Smokeless tobacco: Never Used  Substance and Sexual Activity  . Alcohol use: No  . Drug use: No  . Sexual activity: Yes    Birth control/protection: None  Lifestyle  . Physical activity:    Days per week: Not on file    Minutes per session: Not on file  . Stress: Not on file  Relationships  . Social connections:    Talks on phone: Not on file    Gets together: Not on file    Attends religious service: Not on file    Active member of club or organization:  Not on file    Attends meetings of clubs or organizations: Not on file    Relationship status: Not on file  . Intimate partner violence:    Fear of current or ex partner: Not on file    Emotionally abused: Not on file    Physically abused: Not on file    Forced sexual activity: Not on file  Other Topics Concern  . Not on file  Social History Narrative  . Not on file   No current facility-administered medications on file prior to encounter.    Current Outpatient Medications on File Prior to Encounter  Medication Sig Dispense Refill  . Prenatal Vit-Fe Fumarate-FA (PRENATAL MULTIVITAMIN) TABS tablet Take 1 tablet by mouth daily.     No Known Allergies  ROS:  Review of Systems  Constitutional: Negative for chills, fatigue and fever.  Respiratory: Negative for shortness of breath.   Cardiovascular: Negative for chest pain.  Gastrointestinal: Positive for nausea. Negative for abdominal pain and vomiting.  Genitourinary: Positive for vaginal bleeding. Negative for difficulty urinating, dysuria, flank pain, pelvic pain, vaginal discharge and vaginal pain.  Neurological: Negative for dizziness and headaches.  Psychiatric/Behavioral: Negative.      I have reviewed patient's Past Medical Hx,  Surgical Hx, Family Hx, Social Hx, medications and allergies.   Physical Exam   Patient Vitals for the past 24 hrs:  BP Temp Temp src Pulse Resp SpO2 Height Weight  12/23/18 0947 134/82 98.7 F (37.1 C) Oral (!) 101 17 100 % 5\' 3"  (1.6 m) 95.5 kg   Constitutional: Well-developed, well-nourished female in no acute distress.  Cardiovascular: normal rate Respiratory: normal effort GI: Abd soft, non-tender. Pos BS x 4 MS: Extremities nontender, no edema, normal ROM Neurologic: Alert and oriented x 4.  GU: Neg CVAT.  PELVIC EXAM: Cervix pink, visually closed, without lesion, small amount dark red bleeding, no fox swab required to visualize cervix, vaginal walls and external genitalia  normal Bimanual exam: Cervix 0/long/high, firm, anterior, neg CMT, uterus nontender, nonenlarged, adnexa without tenderness, enlargement, or mass   LAB RESULTS Results for orders placed or performed during the hospital encounter of 12/23/18 (from the past 24 hour(s))  Urinalysis, Routine w reflex microscopic     Status: Abnormal   Collection Time: 12/23/18 10:08 AM  Result Value Ref Range   Color, Urine YELLOW YELLOW   APPearance HAZY (A) CLEAR   Specific Gravity, Urine 1.025 1.005 - 1.030   pH 6.0 5.0 - 8.0   Glucose, UA NEGATIVE NEGATIVE mg/dL   Hgb urine dipstick LARGE (A) NEGATIVE   Bilirubin Urine NEGATIVE NEGATIVE   Ketones, ur 5 (A) NEGATIVE mg/dL   Protein, ur NEGATIVE NEGATIVE mg/dL   Nitrite NEGATIVE NEGATIVE   Leukocytes,Ua NEGATIVE NEGATIVE   RBC / HPF >50 (H) 0 - 5 RBC/hpf   WBC, UA 0-5 0 - 5 WBC/hpf   Bacteria, UA FEW (A) NONE SEEN   Squamous Epithelial / LPF 0-5 0 - 5   Mucus PRESENT   Pregnancy, urine POC     Status: Abnormal   Collection Time: 12/23/18 10:09 AM  Result Value Ref Range   Preg Test, Ur POSITIVE (A) NEGATIVE  Wet prep, genital     Status: Abnormal   Collection Time: 12/23/18 10:37 AM  Result Value Ref Range   Yeast Wet Prep HPF POC NONE SEEN NONE SEEN   Trich, Wet Prep NONE SEEN NONE SEEN   Clue Cells Wet Prep HPF POC NONE SEEN NONE SEEN   WBC, Wet Prep HPF POC MODERATE (A) NONE SEEN   Sperm NONE SEEN   CBC     Status: Abnormal   Collection Time: 12/23/18 10:52 AM  Result Value Ref Range   WBC 4.9 4.0 - 10.5 K/uL   RBC 4.98 3.87 - 5.11 MIL/uL   Hemoglobin 12.0 12.0 - 15.0 g/dL   HCT 00.3 79.4 - 44.6 %   MCV 75.5 (L) 80.0 - 100.0 fL   MCH 24.1 (L) 26.0 - 34.0 pg   MCHC 31.9 30.0 - 36.0 g/dL   RDW 19.0 12.2 - 24.1 %   Platelets 238 150 - 400 K/uL   nRBC 0.0 0.0 - 0.2 %  hCG, quantitative, pregnancy     Status: Abnormal   Collection Time: 12/23/18 10:52 AM  Result Value Ref Range   hCG, Beta Chain, Quant, S 23,812 (H) <5 mIU/mL  ABO/Rh      Status: None   Collection Time: 12/23/18 11:23 AM  Result Value Ref Range   ABO/RH(D)      B NEG Performed at [facility], [address] [place], [place] [address]     --/--/B NEG Performed at [facility], [address], [place], [place] [address]  (  03/06 1123)  IMAGING US Ob Less Than 14 Weeks With Ob Transvaginal  Result Date: 12/23/2018 CLINICAL DATA:  Vaginal bleeding in 1st trimester pregnancy. Previous ectopic pregnancy. Gestational age by LMP of 7 weeks 3 days. EXAM: OBSTETRIC <14 WK [place] AND TRANSVAGINAL OB US TECHNIQUE: Both transabdominal and transvaginal ultrasound examinations were performed for complete evaluation of the gestation as well as the maternal uterus, adnexal regions, and pelvic cul-de-sac. Transvaginal technique was performed to assess early pregnancy. COMPARISON:  None. FINDINGS: Intrauterine gestational sac: Located in right uterine cornual region, and appears to be partially surrounded by myometrium Yolk sac:  Not Visualized. Embryo:  Visualized. Cardiac Activity: None CRL:  18 mm   8 w   2 d Subchorionic hemorrhage:  None visualized. Maternal uterus/adnexae: Both ovaries are normal in appearance. No evidence free fluid. IMPRESSION: Failed 8 week pregnancy located in the right cornual region of the uterus, highly suspicious for cornual ectopic pregnancy. No evidence of hemoperitoneum. Critical Value/emergent results were called by telephone at the time of interpretation on 12/23/2018 at 11:57 am to Karcyn Menn LEFTWICH-KIRBY , who verbally acknowledged these results. Electronically Signed   By: Myles Rosenthal M.D.   On: 12/23/2018 11:59    MAU Management/MDM: Orders Placed This Encounter  Procedures  . Wet prep, genital  . US OB LESS THAN 14 WEEKS WITH OB TRANSVAGINAL  . Urinalysis, Routine w reflex microscopic  . CBC  . hCG, quantitative, pregnancy  . HIV Antibody (routine testing w rflx)  . Pregnancy, urine POC  . ABO/Rh  . Rh IG workup (includes  ABO/Rh)    No orders of the defined types were placed in this encounter.   [place] indicates 8 week demise, and likely cornual ectopic.  Pt stable, denies pain.  Rh negative, rhogam workup ordered.  Consult Dr Langston Masker who is in delivery and will return call. Report to Gerrit Heck, CNM.   Sharen Counter Certified Nurse-Midwife 12/23/2018  12:41 PM

## 2018-12-23 NOTE — Transfer of Care (Signed)
Immediate Anesthesia Transfer of Care Note  Patient: Mary Cortez  Procedure(s) Performed: DIAGNOSTIC LAPAROSCOPY WITH REMOVAL OF ECTOPIC PREGNANCY (N/A Abdomen) Exploratory Laparotomy for removal of right corneal ectopic pregnancy (N/A Abdomen)  Patient Location: PACU  Anesthesia Type:General  Level of Consciousness: awake, alert  and patient cooperative  Airway & Oxygen Therapy: Patient Spontanous Breathing and Patient connected to nasal cannula oxygen  Post-op Assessment: Report given to RN and Post -op Vital signs reviewed and stable  Post vital signs: Reviewed and stable  Last Vitals:  Vitals Value Taken Time  BP 124/65 12/23/2018  7:05 PM  Temp    Pulse 104 12/23/2018  7:18 PM  Resp 14 12/23/2018  7:18 PM  SpO2 99 % 12/23/2018  7:18 PM  Vitals shown include unvalidated device data.  Last Pain:  Vitals:   12/23/18 1445  TempSrc: Axillary  PainSc:          Complications: No apparent anesthesia complications

## 2018-12-23 NOTE — Anesthesia Postprocedure Evaluation (Signed)
Anesthesia Post Note  Patient: Mary Cortez  Procedure(s) Performed: DIAGNOSTIC LAPAROSCOPY WITH REMOVAL OF ECTOPIC PREGNANCY (N/A Abdomen) Exploratory Laparotomy for removal of right corneal ectopic pregnancy (N/A Abdomen)     Patient location during evaluation: PACU Anesthesia Type: General Level of consciousness: awake Pain management: pain level controlled Vital Signs Assessment: post-procedure vital signs reviewed and stable Cardiovascular status: stable Postop Assessment: no backache Anesthetic complications: no    Last Vitals:  Vitals:   12/23/18 0947 12/23/18 1445  BP: 134/82 118/72  Pulse: (!) 101 (!) 103  Resp: 17 18  Temp: 37.1 C 37.3 C  SpO2: 100% 100%    Last Pain:  Vitals:   12/23/18 1445  TempSrc: Axillary  PainSc:                  Aniqua Briere

## 2018-12-23 NOTE — Anesthesia Preprocedure Evaluation (Addendum)
Anesthesia Evaluation  Patient identified by MRN, date of birth, ID band Patient awake    Reviewed: Allergy & Precautions, H&P , NPO status , Patient's Chart, lab work & pertinent test results  Airway Mallampati: II  TM Distance: >3 FB Neck ROM: Full    Dental no notable dental hx. (+) Teeth Intact, Dental Advisory Given   Pulmonary neg pulmonary ROS,    Pulmonary exam normal breath sounds clear to auscultation       Cardiovascular negative cardio ROS   Rhythm:Regular Rate:Normal     Neuro/Psych negative neurological ROS  negative psych ROS   GI/Hepatic negative GI ROS, Neg liver ROS,   Endo/Other  negative endocrine ROS  Renal/GU negative Renal ROS  negative genitourinary   Musculoskeletal   Abdominal   Peds  Hematology negative hematology ROS (+)   Anesthesia Other Findings   Reproductive/Obstetrics negative OB ROS                            Anesthesia Physical Anesthesia Plan  ASA: II and emergent  Anesthesia Plan: General   Post-op Pain Management:    Induction: Intravenous, Rapid sequence and Cricoid pressure planned  PONV Risk Score and Plan: 4 or greater and Ondansetron, Dexamethasone and Midazolam  Airway Management Planned: Oral ETT  Additional Equipment:   Intra-op Plan:   Post-operative Plan: Extubation in OR  Informed Consent: I have reviewed the patients History and Physical, chart, labs and discussed the procedure including the risks, benefits and alternatives for the proposed anesthesia with the patient or authorized representative who has indicated his/her understanding and acceptance.     Dental advisory given  Plan Discussed with: CRNA  Anesthesia Plan Comments:         Anesthesia Quick Evaluation

## 2018-12-23 NOTE — Op Note (Signed)
Operative Note   12/23/2018  PRE-OP DIAGNOSIS *8 week interstitial pregnancy *IUFD   POST-OP DIAGNOSIS *Same   SURGEON: Surgeon(s) and Role:    * Halstead Bing, MD - Primary  ASSISTANT: Conan Bowens, MD & Jaynie Collins, MD  PROCEDURE: Diagnostic laparoscopy, exploratory laparotomy, right salpingectomy with right  cornual wedge resection  ANESTHESIA: General and local  ESTIMATED BLOOD LOSS:  DRAINS: UOP   TOTAL IV FLUIDS: 2U PRBCs, albumin 5%, crystalloid  VTE PROPHYLAXIS: SCDs to the bilateral lower extremities  ANTIBIOTICS: not indicated  SPECIMENS: ectopic pregnancy, right cornua and right fallopian tube  DISPOSITION: PACU - hemodynamically stable.  CONDITION: stable  COMPLICATIONS: with manipulation of the right ectopic during laparoscopy, the ectopic ruptured and heavy bleeding ensued which led to the exploratory laparotomy  FINDINGS: liver adhesion to the posterior aspect of the chest. Normal ovaries bilaterally. Normal left fallopian tube. Engorged right cornua with otherwise normal fallopian tube  DESCRIPTION OF PROCEDURE: After informed consent was obtained, the patient was taken to the operating room where anesthesia was obtained without difficulty. The patient was positioned in the dorsal lithotomy position in [place] stirrups and her arms were carefully tucked at her sides and the usual precautions were taken.  She was prepped and draped in normal sterile fashion.  Time-out was performed and a Foley catheter was placed into the bladder. A standard Hulka uterine manipulator was then placed in the uterus without incident. Gloves were then changed, and after injection of local anesthesia, the open technique was used to place an infraumbilical 12-mm baloon trocar under direct visualization. The laparoscope was introduced and CO2 gas was infused for pneumoperitoneum to a pressure of 15 mm Hg and the area below inspected for injury.  The  patient was placed in Trendelenburg and the bowel was displaced up into the upper abdomen, and the right and left lateral 5-mm ports and an 10 mm suprapubic port were placed under direct visualization of the laparoscope, after injection of local anesthesia.    The above noted findings were seen and with manipulation of the right tube, the ectopic ruptured and started bleeding heavily; the decision was made to convert to immediate ex-lap. A pfannenstiel skin incision was made at the prior c-section scar and the abdomen entered. The alexis retractor was placed and the bowel was packed with lap sponges and the uterus visualized and the fundus grasped with a single toothed tenaculum. Using the bovie, a wedge piece of the right cornua was incision and the piece removed along with the ectopic; any remaining tissue was removed from the uterus and sent to pathology.  Using 1-0 monocryl the defect was closed in a running locking fashion and then another running locking stitch was used to further close the defect; three more figure of eight stitches were then placed for hemostasis. The right fallopian tube remnant was then noted and a kelly clamp placed underneath it in the mesosalpingx and the tube was cut and removed and using 2-0 monocryl the area was suture ligated; several more sutures were used in a figure of eight fashion to oversew this area for hemostasis. The abdomen was then irrigated and hemostasis noted from all operative sites.   X-ray was then taken due to being unable to do a formal count prior to converting to ex-lap and the pictures were negative and the counts were correct.   Going intraabdominally the umbilical and suprapubic fascia was closed with a figure of eight 1-0 vicryl stitch. A  1-0 monocryl figure of eight was then placed on the right rectus muscle due to a bleeding perforator, the fascia was closed with a running suture of 1-0 vicryl. The subcutaneous tissue was closed with 2-0 plain gut and  the skin closed with 4-0 monocryl. The suprapubic and the umbilical port was closed with 4-0 monocryl and the other skin incisions closed with dermabond. Silver nitrated was applied to the tenaculum and the Hulka sites. Final count was correct.  The patient was taken to recovery room in excellent condition.  The patient was advised pre operatively and on POD#1 that she will need a c-section about a month before her due date for further pregnancies and I recommend serial HCGs weekly until negative.   Cornelia Copa MD Attending [facility] for [employer] [facility])

## 2018-12-24 ENCOUNTER — Encounter (HOSPITAL_COMMUNITY): Payer: Self-pay | Admitting: Obstetrics and Gynecology

## 2018-12-24 LAB — BASIC METABOLIC PANEL
Anion gap: 6 (ref 5–15)
BUN: <5 mg/dL — ABNORMAL LOW (ref 6–20)
CO2: 20 mmol/L — ABNORMAL LOW (ref 22–32)
Calcium: 8.8 mg/dL — ABNORMAL LOW (ref 8.9–10.3)
Chloride: 108 mmol/L (ref 98–111)
Creatinine, Ser: 0.58 mg/dL (ref 0.44–1.00)
GFR calc Af Amer: >60 mL/min (ref >60)
GFR calc non Af Amer: >60 mL/min (ref >60)
Glucose, Bld: 139 mg/dL — ABNORMAL HIGH (ref 70–99)
Potassium: 4.1 mmol/L (ref 3.5–5.1)
Sodium: 134 mmol/L — ABNORMAL LOW (ref 135–145)

## 2018-12-24 LAB — RH IG WORKUP (INCLUDES ABO/RH)
ABO/RH(D): B NEG
Antibody Screen: NEGATIVE
Gestational Age(Wks): 8
Unit division: 0

## 2018-12-24 LAB — CBC
HCT: 31.8 % — ABNORMAL LOW (ref 36.0–46.0)
Hemoglobin: 10.8 g/dL — ABNORMAL LOW (ref 12.0–15.0)
MCH: 26.6 pg (ref 26.0–34.0)
MCHC: 34 g/dL (ref 30.0–36.0)
MCV: 78.3 fL — ABNORMAL LOW (ref 80.0–100.0)
Platelets: 185 10*3/uL (ref 150–400)
RBC: 4.06 MIL/uL (ref 3.87–5.11)
RDW: 14.8 % (ref 11.5–15.5)
WBC: 14.3 10*3/uL — ABNORMAL HIGH (ref 4.0–10.5)
nRBC: 0 % (ref 0.0–0.2)

## 2018-12-24 LAB — ABO/RH: ABO/RH(D): B NEG

## 2018-12-24 LAB — HIV ANTIBODY (ROUTINE TESTING W REFLEX): HIV Screen 4th Generation wRfx: NONREACTIVE

## 2018-12-24 MED ORDER — OXYCODONE HCL 5 MG PO TABS
10.0000 mg | ORAL_TABLET | ORAL | Status: DC | PRN
  Administered 2018-12-24: 10 mg via ORAL
  Filled 2018-12-24: qty 2

## 2018-12-24 MED ORDER — OXYCODONE HCL 5 MG PO TABS: 5.0000 mg | ORAL_TABLET | ORAL | Status: DC | PRN

## 2018-12-24 MED ORDER — PANTOPRAZOLE SODIUM 40 MG PO TBEC
40.0000 mg | DELAYED_RELEASE_TABLET | Freq: Every day | ORAL | Status: DC
  Filled 2018-12-24: qty 1

## 2018-12-24 MED ORDER — IBUPROFEN 600 MG PO TABS
600.0000 mg | ORAL_TABLET | Freq: Four times a day (QID) | ORAL | Status: DC
  Administered 2018-12-24 (×3): 600 mg via ORAL
  Filled 2018-12-24 (×4): qty 1

## 2018-12-24 MED ORDER — GABAPENTIN 400 MG PO CAPS
400.0000 mg | ORAL_CAPSULE | Freq: Two times a day (BID) | ORAL | Status: DC
  Administered 2018-12-24 (×2): 400 mg via ORAL
  Filled 2018-12-24 (×2): qty 1

## 2018-12-24 MED ORDER — ACETAMINOPHEN 500 MG PO TABS
1000.0000 mg | ORAL_TABLET | Freq: Four times a day (QID) | ORAL | Status: DC | PRN
  Administered 2018-12-25: 1000 mg via ORAL
  Filled 2018-12-24: qty 2

## 2018-12-24 MED ORDER — OXYCODONE-ACETAMINOPHEN 5-325 MG PO TABS: 1.0000 | ORAL_TABLET | Freq: Four times a day (QID) | ORAL | 0 refills | Status: DC | PRN

## 2018-12-24 NOTE — Progress Notes (Signed)
Pt. arrived to the unit from PACU, A&O x4, VSS. No complaints at this time. Confirmed with PACU RN CJ that blood administration had been done in the OR during surgery. Pt. Husband at bedside. Pt. oriented to room and to call bell. Will continue to monitor.

## 2018-12-24 NOTE — Progress Notes (Signed)
1 Day Post-Op Procedure(s) (LRB): DIAGNOSTIC LAPAROSCOPY WITH REMOVAL OF ECTOPIC PREGNANCY (N/A) Exploratory Laparotomy for removal of right corneal ectopic pregnancy (N/A) Subjective: Patient reports pain as mild-moderate in intensity, on PCA.  Foley still in place.  No OOB yet. No flatus yet.  Objective: Vital signs in last 24 hours: Temp:  [97.6 F (36.4 C)-99.8 F (37.7 C)] 98.7 F (37.1 C) (03/07 0554) Pulse Rate:  [78-134] 78 (03/07 0554) Resp:  [12-19] 17 (03/07 0554) BP: (118-146)/(65-87) 122/68 (03/07 0554) SpO2:  [93 %-100 %] 100 % (03/07 0554) Weight:  [95.5 kg] 95.5 kg (03/06 0947)  Intake/Output from previous day: 03/06 0701 - 03/07 0700 In: 4873.6 [P.O.:220; I.V.:3523.6; Blood:630; IV Piggyback:500] Out: 2100 [Urine:1100; Blood:1000] Intake/Output this shift: No intake/output data recorded.  Recent Labs    12/23/18 1052 12/23/18 2141 12/24/18 0309  HGB 12.0 11.6* 10.8*   Recent Labs    12/23/18 2141 12/24/18 0309  WBC 17.1* 14.3*  RBC 4.34 4.06  HCT 34.3* 31.8*  PLT 215 185   Recent Labs    12/24/18 0309  NA 134*  K 4.1  CL 108  CO2 20*  BUN <5*  CREATININE 0.58  GLUCOSE 139*  CALCIUM 8.8*   Physical Exam: Constitutional: No acute distress.  Cardiovascular: normal rate Respiratory: normal effort GI: Abd soft, non-tender. Pos BS x 4, incision C/D/I, no erythema MS: Extremities nontender, no edema, normal ROM Neurologic: Alert and oriented x 4.   Assessment/Plan: 1 Day Post-Op Procedure(s) (LRB): DIAGNOSTIC LAPAROSCOPY WITH REMOVAL OF ECTOPIC PREGNANCY (N/A) Exploratory Laparotomy for removal of right corneal ectopic pregnancy (N/A) Advance diet  Discontinue foley Discontinue PCA, start prescribed oral analgesia OOB, ambulation recommended Stable hemoglobin this morning, recheck in am Routine postoperative care, plan for discharge tomorrow  Jaynie Collins, MD 12/24/2018, 7:24 AM

## 2018-12-24 NOTE — Discharge Instructions (Signed)
Exploratory Laparotomy, Adult, Care After °This sheet gives you information about how to care for yourself after your procedure. Your health care provider may also give you more specific instructions. If you have problems or questions, contact your health care provider. °What can I expect after the procedure? °After the procedure, it is common to have: °· Abdominal soreness. °· Fatigue. °· A sore throat from the tube in your throat. °· A lack of appetite. °Follow these instructions at home: °Medicines °· Take over-the-counter and prescription medicines only as told by your health care provider. °· If you were prescribed an antibiotic medicine, take it as told by your health care provider. Do not stop taking the antibiotic even if you start to feel better. °· Do not drive or operate heavy machinery while taking pain medicine. °· If you are taking prescription pain medicine, take actions to prevent or treat constipation. Your health care provider may recommend that you: °? Drink enough fluid to keep your urine pale yellow. °? Eat foods that are high in fiber, such as fresh fruits and vegetables, whole grains, and beans. °? Limit foods that are high in fat and processed sugars, such as fried or sweet foods. °? Take an over-the-counter or prescription medicine for constipation. Undergoing surgery and taking pain medicines can make constipation worse. °Incision care ° °· Follow instructions from your health care provider about how to take care of your incision. Make sure you: °? Wash your hands with soap and water before you change your bandage (dressing). If soap and water are not available, use hand sanitizer. °? Change your dressing as told by your health care provider. °? Leave stitches (sutures), skin glue, or adhesive strips in place. These skin closures may need to stay in place for 2 weeks or longer. If adhesive strip edges start to loosen and curl up, you may trim the loose edges. Do not remove adhesive strips  completely unless your health care provider tells you to do that. °· If you were sent home with a drain, follow instructions from your health care provider about how to care for it. °· Check your incision area every day for signs of infection. Check for: °? Redness, swelling, or pain. °? Fluid or blood. °? Warmth. °? Pus or a bad smell. °Activity ° °· Rest as told by your health care provider. °? Avoid sitting for a long time without moving. Get up to take short walks every 1-2 hours. This is important to improve blood flow and breathing. Ask for help if you feel weak or unsteady. °· Do not lift anything that is heavier than 5 lb (2.2 kg), or the limit that your health care provider tells you, until he or she says that it is safe. °· Ask your health care provider when you can start to do your usual activities again, such as driving, going back to work, and having sex. °Eating and drinking °· You may eat what you usually eat. Include lots of whole grains, fruits, and vegetables in your diet. This will help to prevent constipation. °· Drink enough fluid to keep your urine pale yellow. °Bathing °· Keep your incision clean and dry. Clean it as often as told by your health care provider: °? Gently wash the incision with soap and water. °? Rinse the incision with water to remove all soap. °? Pat the incision dry with a clean towel. Do not rub the incision. °· You may take showers after 48 hours. °· Do not take baths,   swim, or use a hot tub until your health care provider says it is okay to do so. General instructions  Do not use any products that contain nicotine or tobacco, such as cigarettes and e-cigarettes. These can delay healing after surgery. If you need help quitting, ask your health care provider.  Wear compression stockings as told by your health care provider. These stockings help to prevent blood clots and reduce swelling in your legs.  Keep all follow-up visits as told by your health care provider.  This is important. Contact a health care provider if:  You have a fever.  You have chills.  Your pain medicine is not helping.  You have constipation or diarrhea.  You have nausea or vomiting.  You have drainage, redness, swelling, or pain at your incision site. Get help right away if:  Your pain is getting worse.  You have not had a bowel movement for more than 3 days.  You have ongoing (persistent) vomiting.  The edges of your incision open up.  You have warmth, tenderness, and swelling in your calf.  You have trouble breathing.  You have chest pain. These symptoms may represent a serious problem that is an emergency. Do not wait to see if the symptoms will go away. Get medical help right away. Call your local emergency services (911 in the [place]). Do not drive yourself to the hospital. Summary  Abdominal soreness is common after exploratory laparotomy. Take over-the-counter and prescription pain medicines only as told by your health care provider.  Follow instructions from your health care provider about how to take care of your incision. Do not take baths, swim, or use a hot tub until your health care provider says it is okay to do so.  Watch for signs and symptoms of infection after surgery, including fever, chills, drainage from your incision, and worsening abdominal pain. This information is not intended to replace advice given to you by your health care provider. Make sure you discuss any questions you have with your health care provider. Document Released: 05/19/2004 Document Revised: 10/15/2017 Document Reviewed: 10/15/2017 Elsevier Interactive Patient Education  2019 [employer] Ruptured Ectopic Pregnancy  An ectopic pregnancy is when a fertilized egg attaches (implants) outside of the uterus, usually in a fallopian tube. A ruptured ectopic pregnancy is when the fallopian tube tears or bursts. This results in internal bleeding, intense abdominal pain,  and sometimes, vaginal bleeding. Most ectopic pregnancies occur in the fallopian tube. In rare cases, it may occur on the ovary, intestine, pelvis, or cervix. An ectopic pregnancy does not have the ability to develop into a normal, healthy baby. A ruptured ectopic pregnancy can affect your ability to have children (fertility), depending on damage it causes to your reproductive organs. Ruptured ectopic pregnancy is a medical emergency. If not treated immediately, it can lead to blood loss, shock, or even death. What are the causes? Most ectopic pregnancies are caused by damage to the fallopian tubes. The damage prevents the fertilized egg from implanting in the uterus. In some cases, the cause may not be known. What increases the risk? You are at increased risk for an ectopic pregnancy if:  You have had a previous ectopic pregnancy.  You have had previous fallopian tube surgery.  You have had previous surgery to have the fallopian tubes tied (tubal ligation).  You have had infertility treatments or have a history of infertility.  You have been exposed to DES. DES is a medicine that was used until  1971 and had effects on babies whose mothers took the medicine.  You use an IUD (intrauterine device) for birth control.  You use progestin-only oral contraception for birth control.  You have a history of pelvic inflammatory disease (PID).  You have a history of endometriosis.  You smoke.  You became sexually active before 34 years of age.  You have multiple sexual partners. What are the signs or symptoms? Symptoms of a ruptured ectopic pregnancy and internal bleeding may include:  Sudden, severe pain in the abdomen and pelvis.  Dizziness or fainting.  Pain in the shoulder area.  Vaginal bleeding. How is this diagnosed? This condition is diagnosed based on your medical history, symptoms, a physical exam, and tests, which may include:  A pregnancy test.  An  ultrasound.  Measuring the levels of the pregnancy hormone in the bloodstream.  Taking a sample of tissue from the uterus (dilation and curettage, D&C).  Surgery to visually examine the inside of the abdomen using a lighted tube (laparoscopy). How is this treated? This condition is treated with IV fluids and emergency surgery to remove the ectopic pregnancy and repair the area where the rupture occured. If you have lost a lot of blood, you may need a blood transfusion. If you are Rh negative and your baby's father is Rh positive, or the Rh type of the father is unknown, you may receive a Rho (D) immune globulin shot. This is to prevent Rh problems in future pregnancies. Additional medicines may be given. Get help right away if:  You are taking medicines to treat an ectopic pregnancy and you develop symptoms of a rupture. These include: ? Fever or chills. ? Shoulder pain. ? Vaginal bleeding. ? Nausea and vomiting. ? Severe abdominal pain or cramping. ? Feeling light-headed or fainting. Summary  An ectopic pregnancy is when a fertilized egg attaches (implants) outside of the uterus, usually in a fallopian tube. A ruptured ectopic pregnancy is when the fallopian tube tears or bursts.  Ruptured ectopic pregnancy is a medical emergency. If not treated immediately, it can lead to blood loss, shock, or even death.  This condition is treated with IV fluids and emergency surgery to remove the ectopic pregnancy and repair the area where the rupture occured. If you have lost a lot of blood, you may need a blood transfusion. This information is not intended to replace advice given to you by your health care provider. Make sure you discuss any questions you have with your health care provider. Document Released: 10/02/2000 Document Revised: 12/23/2016 Document Reviewed: 12/23/2016 Elsevier Interactive Patient Education  2019 [employer].

## 2018-12-24 NOTE — Plan of Care (Signed)
  Problem: Education: Goal: Knowledge of General Education information will improve Description Including pain rating scale, medication(s)/side effects and non-pharmacologic comfort measures Outcome: Progressing   Problem: Health Behavior/Discharge Planning: Goal: Ability to manage health-related needs will improve Outcome: Progressing   Problem: Clinical Measurements: Goal: Ability to maintain clinical measurements within normal limits will improve Outcome: Progressing Goal: Will remain free from infection Outcome: Progressing Goal: Respiratory complications will improve Outcome: Progressing   Problem: Activity: Goal: Risk for activity intolerance will decrease Outcome: Progressing   Problem: Nutrition: Goal: Adequate nutrition will be maintained Outcome: Progressing   Problem: Coping: Goal: Level of anxiety will decrease Outcome: Progressing   Problem: Pain Managment: Goal: General experience of comfort will improve Outcome: Progressing   Problem: Safety: Goal: Ability to remain free from injury will improve Outcome: Progressing   Problem: Skin Integrity: Goal: Risk for impaired skin integrity will decrease Outcome: Progressing   Problem: Education: Goal: Knowledge of diagnostic tests will improve Outcome: Progressing Goal: Knowledge of disease or condition will improve Outcome: Progressing Goal: Knowledge of the prescribed therapeutic regimen will improve Outcome: Progressing   Problem: Coping: Goal: Ability to identify appropriate support needs will improve Outcome: Progressing Goal: Ability to verbalize feelings will improve Outcome: Progressing Goal: Level of anxiety will decrease Outcome: Progressing   Problem: Fluid Volume: Goal: Will maintain adequate fluid volume Outcome: Progressing   Problem: Physical Regulation: Goal: Complications related to the disease process, condition or treatment will be avoided or minimized Outcome: Progressing    Problem: Pain Management: Goal: General experience of comfort will improve Outcome: Progressing

## 2018-12-24 NOTE — Plan of Care (Signed)
  Problem: Activity: Goal: Risk for activity intolerance will decrease Outcome: Progressing   Problem: Nutrition: Goal: Adequate nutrition will be maintained Outcome: Progressing   Problem: Coping: Goal: Level of anxiety will decrease Outcome: Progressing   Problem: Pain Managment: Goal: General experience of comfort will improve Outcome: Progressing   Problem: Safety: Goal: Ability to remain free from injury will improve Outcome: Progressing   Problem: Skin Integrity: Goal: Risk for impaired skin integrity will decrease Outcome: Progressing   Problem: Coping: Goal: Ability to identify appropriate support needs will improve Outcome: Progressing Goal: Ability to verbalize feelings will improve Outcome: Progressing   Problem: Physical Regulation: Goal: Complications related to the disease process, condition or treatment will be avoided or minimized Outcome: Progressing   Problem: Pain Management: Goal: General experience of comfort will improve Outcome: Progressing

## 2018-12-24 NOTE — Progress Notes (Signed)
SCD's unavailable last night and were ordered. SCD's were not delivered until the morning.

## 2018-12-25 LAB — CBC
HCT: 29.2 % — ABNORMAL LOW (ref 36.0–46.0)
Hemoglobin: 9.6 g/dL — ABNORMAL LOW (ref 12.0–15.0)
MCH: 25.9 pg — ABNORMAL LOW (ref 26.0–34.0)
MCHC: 32.9 g/dL (ref 30.0–36.0)
MCV: 78.7 fL — ABNORMAL LOW (ref 80.0–100.0)
Platelets: 159 10*3/uL (ref 150–400)
RBC: 3.71 MIL/uL — ABNORMAL LOW (ref 3.87–5.11)
RDW: 15.6 % — ABNORMAL HIGH (ref 11.5–15.5)
WBC: 10.1 10*3/uL (ref 4.0–10.5)
nRBC: 0 % (ref 0.0–0.2)

## 2018-12-25 NOTE — Discharge Summary (Signed)
Physician Discharge Summary  Patient ID: SEE OMALLEY MRN: [id number] DOB/AGE: [birth year] 34 y.o.  Admit date: 12/23/2018 Discharge date: 12/25/2018  Admission Diagnoses:ectopic pregnancy  Discharge Diagnoses:  Active Problems:   History of ectopic pregnancy   S/P exploratory laparotomy Ruptured cornual pregnancy  Discharged Condition: good  Hospital Course: Chief Complaint: Possible Pregnancy; Nausea; Vaginal Bleeding; and Abdominal Pain   First Provider Initiated Contact with Patient 12/23/18 1028      SUBJECTIVE HPI: Mary Cortez is a 33 y.o. G4P1020 at [redacted]w[redacted]d by LMP who presents to maternity admissions reporting spotting off and on x 11 days with positive HPT yesterday.  LMP was January 14 with hx of regular periods.  Pt has hx of ectopic pregnancy so wanted to be evaluated. The bleeding is light, pink and needs a pantyliner only. There is some nausea daily but none now. There are no other symptoms. She has not tried any treatments.  She denies vaginal bleeding, vaginal itching/burning, urinary symptoms, h/a, dizziness, n/v, or fever/chills.   She was seen by Dr Vergie Living, Patient had not been seen yet by [facility] so they declined evaluating her.  Patient with prior right sided ectopic tx with weekly methotrexate in the past. She states that she made an appointment a few weeks ago with [facility] and her new OB visit is in April; patient states she's unsure if she told them about her prior ectopic history. She had spotting and right sided pain and called them and they recommended she come to [facility] for evaluation  U/s shows what looks to be right sided cornual ectopic.He  d/w her that since her beta hcg is so high and the large size of the pregnancy that he did not recommend methotrexate treatment and recommend surgery. She was told that risk of bleeding, need for hysterectomy and damage to surrounding structures is possible with surgery but it's the safest  option PRE-OP DIAGNOSIS *8 week interstitial pregnancy *IUFD   POST-OP DIAGNOSIS *Same   SURGEON: Surgeon(s) and Role:    * Red Bud Bing, MD - Primary  ASSISTANT: Conan Bowens, MD & Jaynie Collins, MD  PROCEDURE: Diagnostic laparoscopy, exploratory laparotomy, right salpingectomy with right  cornual wedge resection  ANESTHESIA: General and local  ESTIMATED BLOOD LOSS:  DRAINS: UOP   TOTAL IV FLUIDS: 2U PRBCs, albumin 5%, crystalloid  VTE PROPHYLAXIS: SCDs to the bilateral lower extremities  ANTIBIOTICS: not indicated  SPECIMENS: ectopic pregnancy, right cornua and right fallopian tube  DISPOSITION: PACU - hemodynamically stable.  CONDITION: stable  COMPLICATIONS: with manipulation of the right ectopic during laparoscopy, the ectopic ruptured and heavy bleeding ensued which led to the exploratory laparotomy  FINDINGS: liver adhesion to the posterior aspect of the chest. Normal ovaries bilaterally. Normal left fallopian tube. Engorged right cornua with otherwise normal fallopian tube HPI  Consults: None  Significant Diagnostic Studies: radiology: Ultrasound: right ectopic pregnancy  Treatments: IV hydration, surgery: laparotomy after laparoscopy and  Transfusion 2 units PRBC  Discharge Exam: Blood pressure 110/64, pulse 92, temperature 98.6 F (37 C), temperature source Oral, resp. rate 20, height 5\' 3"  (1.6 m), weight 95.5 kg, last menstrual period 11/01/2018, SpO2 100 %, unknown if currently breastfeeding. General appearance: alert, cooperative and no distress GI: soft, non-tender; bowel sounds normal; no masses,  no organomegaly Extremities: extremities normal, atraumatic, no cyanosis or edema Incision/Wound:dressing dry and intact  Disposition:    Allergies as of 12/25/2018   No Known Allergies  Medication List    TAKE these medications   oxyCODONE-acetaminophen 5-325 MG tablet Commonly known as:   PERCOCET/ROXICET Take 1-2 tablets by mouth every 6 (six) hours as needed.   prenatal multivitamin Tabs tablet Take 1 tablet by mouth daily.      Follow-up Information    [facility].   Specialty:  Obstetrics and Gynecology Contact information: [address] [place] [address] [phone]          Signed: Scheryl Darter 12/25/2018, 1:04 PM

## 2018-12-25 NOTE — H&P (Signed)
Attestation of Attending Supervision of Certified Nurse Midwife: Evaluation and management procedures were performed by the CNM under my supervision.  I have seen and examined the patient,  reviewed the CNM's note and chart, and I agree with the management and plan.   Patient has not been seen yet by [facility] so they declined evaluating her.  Patient with prior right sided ectopic tx with weekly methotrexate in the past. She states that she made an appointment a few weeks ago with [facility] and her new OB visit is in April; patient states she's unsure if she told them about her prior ectopic history. She had spotting and right sided pain and called them and they recommended she come to MAU for evaluation  U/s shows what looks to be right sided cornual ectopic. I d/w her that since her beta hcg is so high and the large size of the pregnancy that I don't recommend methotrexate treatment and recommend surgery. I told her that risk of bleeding, need for hysterectomy and damage to surrounding structures is possible with surgery but it's the safest option  Patient states she had a few sips of water prior to coming into to the MAU but nothing since and no breakfast today. Patient stable and call OR for posting.   Cornelia Copa MD Attending Center for [facility])      Expand All Collapse All    Show:Clear all [x] Manual[x] Template[] Copied  Added by: [x] Leftwich-Kirby, Wilmer Floor, CNM  [] Hover for details Chief Complaint: Possible Pregnancy; Nausea; Vaginal Bleeding; and Abdominal Pain   First Provider Initiated Contact with Patient 12/23/18 1028      SUBJECTIVE HPI: Mary Cortez is a 33 y.o. G4P1020 at [redacted]w[redacted]d by LMP who presents to maternity admissions reporting spotting off and on x 11 days with positive HPT yesterday.  LMP was January 14 with hx of regular periods.  Pt has hx of ectopic pregnancy so wanted to be evaluated. The  bleeding is light, pink and needs a pantyliner only. There is some nausea daily but none now. There are no other symptoms. She has not tried any treatments.  She denies vaginal bleeding, vaginal itching/burning, urinary symptoms, h/a, dizziness, n/v, or fever/chills.     HPI      Past Medical History:  Diagnosis Date  . Anemia   . Ectopic pregnancy   . Medical history non-contributory         Past Surgical History:  Procedure Laterality Date  . CESAREAN SECTION    . CYSTECTOMY    . TONSILLECTOMY     Social History        Socioeconomic History  . Marital status: Significant Other    Spouse name: Not on file  . Number of children: Not on file  . Years of education: Not on file  . Highest education level: Not on file  Occupational History  . Not on file  Social Needs  . Financial resource strain: Not on file  . Food insecurity:    Worry: Not on file    Inability: Not on file  . Transportation needs:    Medical: Not on file    Non-medical: Not on file  Tobacco Use  . Smoking status: Never Smoker  . Smokeless tobacco: Never Used  Substance and Sexual Activity  . Alcohol use: No  . Drug use: No  . Sexual activity: Yes    Birth control/protection: None  Lifestyle  . Physical activity:  Days per week: Not on file    Minutes per session: Not on file  . Stress: Not on file  Relationships  . Social connections:    Talks on phone: Not on file    Gets together: Not on file    Attends religious service: Not on file    Active member of club or organization: Not on file    Attends meetings of clubs or organizations: Not on file    Relationship status: Not on file  . Intimate partner violence:    Fear of current or ex partner: Not on file    Emotionally abused: Not on file    Physically abused: Not on file    Forced sexual activity: Not on file  Other Topics Concern  . Not on file  Social History Narrative  . Not  on file   No current facility-administered medications on file prior to encounter.          Current Outpatient Medications on File Prior to Encounter  Medication Sig Dispense Refill  . Prenatal Vit-Fe Fumarate-FA (PRENATAL MULTIVITAMIN) TABS tablet Take 1 tablet by mouth daily.     No Known Allergies  ROS:  Review of Systems  Constitutional: Negative for chills, fatigue and fever.  Respiratory: Negative for shortness of breath.   Cardiovascular: Negative for chest pain.  Gastrointestinal: Positive for nausea. Negative for abdominal pain and vomiting.  Genitourinary: Positive for vaginal bleeding. Negative for difficulty urinating, dysuria, flank pain, pelvic pain, vaginal discharge and vaginal pain.  Neurological: Negative for dizziness and headaches.  Psychiatric/Behavioral: Negative.      I have reviewed patient's Past Medical Hx, Surgical Hx, Family Hx, Social Hx, medications and allergies.   Physical Exam   Patient Vitals for the past 24 hrs:  BP Temp Temp src Pulse Resp SpO2 Height Weight  12/23/18 0947 134/82 98.7 F (37.1 C) Oral (!) 101 17 100 % 5\' 3"  (1.6 m) 95.5 kg   Constitutional: Well-developed, well-nourished female in no acute distress.  Cardiovascular: normal rate Respiratory: normal effort GI: Abd soft, non-tender. Pos BS x 4 MS: Extremities nontender, no edema, normal ROM Neurologic: Alert and oriented x 4.  GU: Neg CVAT.  PELVIC EXAM: Cervix pink, visually closed, without lesion, small amount dark red bleeding, no fox swab required to visualize cervix, vaginal walls and external genitalia normal Bimanual exam: Cervix 0/long/high, firm, anterior, neg CMT, uterus nontender, nonenlarged, adnexa without tenderness, enlargement, or mass   LAB RESULTS LabResultsLast24Hours  Results for orders placed or performed during the hospital encounter of 12/23/18 (from the past 24 hour(s))  Urinalysis, Routine w reflex microscopic     Status:  Abnormal   Collection Time: 12/23/18 10:08 AM  Result Value Ref Range   Color, Urine YELLOW YELLOW   APPearance HAZY (A) CLEAR   Specific Gravity, Urine 1.025 1.005 - 1.030   pH 6.0 5.0 - 8.0   Glucose, UA NEGATIVE NEGATIVE mg/dL   Hgb urine dipstick LARGE (A) NEGATIVE   Bilirubin Urine NEGATIVE NEGATIVE   Ketones, ur 5 (A) NEGATIVE mg/dL   Protein, ur NEGATIVE NEGATIVE mg/dL   Nitrite NEGATIVE NEGATIVE   Leukocytes,Ua NEGATIVE NEGATIVE   RBC / HPF >50 (H) 0 - 5 RBC/hpf   WBC, UA 0-5 0 - 5 WBC/hpf   Bacteria, UA FEW (A) NONE SEEN   Squamous Epithelial / LPF 0-5 0 - 5   Mucus PRESENT   Pregnancy, urine POC     Status: Abnormal   Collection Time: 12/23/18  10:09 AM  Result Value Ref Range   Preg Test, Ur POSITIVE (A) NEGATIVE  Wet prep, genital     Status: Abnormal   Collection Time: 12/23/18 10:37 AM  Result Value Ref Range   Yeast Wet Prep HPF POC NONE SEEN NONE SEEN   Trich, Wet Prep NONE SEEN NONE SEEN   Clue Cells Wet Prep HPF POC NONE SEEN NONE SEEN   WBC, Wet Prep HPF POC MODERATE (A) NONE SEEN   Sperm NONE SEEN   CBC     Status: Abnormal   Collection Time: 12/23/18 10:52 AM  Result Value Ref Range   WBC 4.9 4.0 - 10.5 K/uL   RBC 4.98 3.87 - 5.11 MIL/uL   Hemoglobin 12.0 12.0 - 15.0 g/dL   HCT 16.1 09.6 - 04.5 %   MCV 75.5 (L) 80.0 - 100.0 fL   MCH 24.1 (L) 26.0 - 34.0 pg   MCHC 31.9 30.0 - 36.0 g/dL   RDW 40.9 81.1 - 91.4 %   Platelets 238 150 - 400 K/uL   nRBC 0.0 0.0 - 0.2 %  hCG, quantitative, pregnancy     Status: Abnormal   Collection Time: 12/23/18 10:52 AM  Result Value Ref Range   hCG, Beta Chain, Quant, S 23,812 (H) <5 mIU/mL  ABO/Rh     Status: None   Collection Time: 12/23/18 11:23 AM  Result Value Ref Range   ABO/RH(D)      B NEG Performed at [facility], [address] [place], [place] [address]       --/--/B NEG Performed at [facility], [address] [place], [place]  [address]  [phone])  IMAGING  ImagingResults  US Ob Less Than 14 Weeks With Ob Transvaginal  Result Date: 12/23/2018 CLINICAL DATA:  Vaginal bleeding in 1st trimester pregnancy. Previous ectopic pregnancy. Gestational age by LMP of 7 weeks 3 days. EXAM: OBSTETRIC <14 WK [place] AND TRANSVAGINAL OB US TECHNIQUE: Both transabdominal and transvaginal ultrasound examinations were performed for complete evaluation of the gestation as well as the maternal uterus, adnexal regions, and pelvic cul-de-sac. Transvaginal technique was performed to assess early pregnancy. COMPARISON:  None. FINDINGS: Intrauterine gestational sac: Located in right uterine cornual region, and appears to be partially surrounded by myometrium Yolk sac:  Not Visualized. Embryo:  Visualized. Cardiac Activity: None CRL:  18 mm   8 w   2 d Subchorionic hemorrhage:  None visualized. Maternal uterus/adnexae: Both ovaries are normal in appearance. No evidence free fluid. IMPRESSION: Failed 8 week pregnancy located in the right cornual region of the uterus, highly suspicious for cornual ectopic pregnancy. No evidence of hemoperitoneum. Critical Value/emergent results were called by telephone at the time of interpretation on 12/23/2018 at 11:57 am to LISA LEFTWICH-KIRBY , who verbally acknowledged these results. Electronically Signed   By: Myles Rosenthal M.D.   On: 12/23/2018 11:59     MAU Management/MDM:    Orders Placed This Encounter  Procedures  . Wet prep, genital  . US OB LESS THAN 14 WEEKS WITH OB TRANSVAGINAL  . Urinalysis, Routine w reflex microscopic  . CBC  . hCG, quantitative, pregnancy  . HIV Antibody (routine testing w rflx)  . Pregnancy, urine POC  . ABO/Rh  . Rh IG workup (includes ABO/Rh)    No orders of the defined types were placed in this encounter.   [place] indicates 8 week demise, and likely cornual ectopic.  Pt stable, denies pain.  Rh negative, rhogam workup  ordered.  Consult Dr Langston Masker who is in delivery and will  return call. Report to Gerrit Heck, CNM.  Sharen Counter Certified Nurse-Midwife 12/23/2018  12:41 PM     H&P converted from MAU note

## 2018-12-25 NOTE — Progress Notes (Signed)
Pt for discharge going home , husband at the bedside, discontinued the peripheral IV line, able to tolerate her meals, ambulates, wound site with honey comb dry and intact, given health teachings, next appointment, due med explained and understood, no s/s of distress noted, given all her personal belongings.

## 2018-12-26 ENCOUNTER — Other Ambulatory Visit: Payer: Self-pay | Admitting: Obstetrics and Gynecology

## 2018-12-26 DIAGNOSIS — Z8759 Personal history of other complications of pregnancy, childbirth and the puerperium: Secondary | ICD-10-CM

## 2018-12-26 LAB — BPAM FFP
Blood Product Expiration Date: 202003092359
Blood Product Expiration Date: 202003112359
Blood Product Expiration Date: 202003112359
Blood Product Expiration Date: 202003112359
ISSUE DATE / TIME: 202003080424
Unit Type and Rh: 7300
Unit Type and Rh: 7300
Unit Type and Rh: 7300
Unit Type and Rh: 8400

## 2018-12-26 LAB — PREPARE FRESH FROZEN PLASMA
Unit division: 0
Unit division: 0

## 2018-12-26 LAB — TYPE AND SCREEN
ABO/RH(D): B NEG
Antibody Screen: NEGATIVE
Unit division: 0
Unit division: 0
Unit division: 0
Unit division: 0
Unit division: 0
Unit division: 0

## 2018-12-26 LAB — POCT I-STAT 4, (NA,K, GLUC, HGB,HCT)
Glucose, Bld: 123 mg/dL — ABNORMAL HIGH (ref 70–99)
HCT: 30 % — ABNORMAL LOW (ref 36.0–46.0)
Hemoglobin: 10.2 g/dL — ABNORMAL LOW (ref 12.0–15.0)
Potassium: 3.7 mmol/L (ref 3.5–5.1)
Sodium: 136 mmol/L (ref 135–145)

## 2018-12-26 LAB — BPAM RBC
Blood Product Expiration Date: 202003182359
Blood Product Expiration Date: 202003252359
Blood Product Expiration Date: 202003262359
Blood Product Expiration Date: 202004012359
Blood Product Expiration Date: 202004022359
Blood Product Expiration Date: 202004022359
ISSUE DATE / TIME: 202003061658
ISSUE DATE / TIME: 202003061658
ISSUE DATE / TIME: 202003061720
ISSUE DATE / TIME: 202003061720
ISSUE DATE / TIME: 202003061720
ISSUE DATE / TIME: 202003071416
Unit Type and Rh: 1700
Unit Type and Rh: 1700
Unit Type and Rh: 1700
Unit Type and Rh: 1700
Unit Type and Rh: 1700
Unit Type and Rh: 9500

## 2018-12-26 LAB — GC/CHLAMYDIA PROBE AMP (~~LOC~~) NOT AT ARMC
Chlamydia: NEGATIVE
Neisseria Gonorrhea: NEGATIVE

## 2018-12-30 ENCOUNTER — Encounter: Payer: Self-pay | Admitting: Family Medicine

## 2018-12-30 ENCOUNTER — Ambulatory Visit (INDEPENDENT_AMBULATORY_CARE_PROVIDER_SITE_OTHER): Payer: BLUE CROSS/BLUE SHIELD

## 2018-12-30 VITALS — BP 118/84 | HR 91 | Wt 213.4 lb

## 2018-12-30 DIAGNOSIS — Z8759 Personal history of other complications of pregnancy, childbirth and the puerperium: Secondary | ICD-10-CM

## 2018-12-30 NOTE — Progress Notes (Signed)
Pt here today for wound check s/p ectopic pregnancy.  Honeycomb dressing removed.  Incision well approximate, no odor, no drainage, and no erythema.  Non stat beta drawn today.  Message sent to front office to schedule pt post op appt with Dr. Vergie Living in one to two weeks.  Pt agreed.

## 2018-12-31 LAB — BETA HCG QUANT (REF LAB): hCG Quant: 295 m[IU]/mL

## 2018-12-31 NOTE — Progress Notes (Signed)
Patient seen and assessed by nursing staff.  Agree with documentation and plan.  

## 2019-01-02 ENCOUNTER — Other Ambulatory Visit: Payer: Self-pay | Admitting: Obstetrics and Gynecology

## 2019-01-02 DIAGNOSIS — Z8759 Personal history of other complications of pregnancy, childbirth and the puerperium: Secondary | ICD-10-CM

## 2019-01-03 ENCOUNTER — Telehealth: Payer: Self-pay

## 2019-01-03 NOTE — Telephone Encounter (Signed)
Called pt to advise her on her hcg levels and to follow up for weekly Bhcgs as needed. Pt verbalized understanding and had no questions.

## 2019-01-04 DIAGNOSIS — Z029 Encounter for administrative examinations, unspecified: Secondary | ICD-10-CM

## 2019-01-06 ENCOUNTER — Other Ambulatory Visit: Payer: Self-pay

## 2019-01-06 ENCOUNTER — Other Ambulatory Visit: Payer: BLUE CROSS/BLUE SHIELD

## 2019-01-06 DIAGNOSIS — Z8759 Personal history of other complications of pregnancy, childbirth and the puerperium: Secondary | ICD-10-CM

## 2019-01-07 LAB — BETA HCG QUANT (REF LAB): hCG Quant: 33 m[IU]/mL

## 2019-01-11 ENCOUNTER — Telehealth: Payer: Self-pay

## 2019-01-11 NOTE — Telephone Encounter (Signed)
Per Dr. Vergie Living, pt needs to come back 7-10 days for rpt non stat beta lab.  Notified pt provider's recommendation.  Pt stated that she will be able to come in on 01/13/19 @ 1000 for non-stat beta. Front office notified to schedule appt.

## 2019-01-16 ENCOUNTER — Other Ambulatory Visit: Payer: Self-pay | Admitting: *Deleted

## 2019-01-16 ENCOUNTER — Other Ambulatory Visit: Payer: BLUE CROSS/BLUE SHIELD

## 2019-01-16 DIAGNOSIS — Z8759 Personal history of other complications of pregnancy, childbirth and the puerperium: Secondary | ICD-10-CM

## 2019-01-17 ENCOUNTER — Inpatient Hospital Stay (HOSPITAL_COMMUNITY)
Admission: AD | Admit: 2019-01-17 | Discharge: 2019-01-17 | Disposition: A | Discharge status: Home / Self Care | Payer: BLUE CROSS/BLUE SHIELD | Attending: Obstetrics & Gynecology | Admitting: Obstetrics & Gynecology

## 2019-01-17 ENCOUNTER — Other Ambulatory Visit: Payer: BLUE CROSS/BLUE SHIELD

## 2019-01-17 ENCOUNTER — Other Ambulatory Visit: Payer: Self-pay

## 2019-01-17 ENCOUNTER — Encounter (HOSPITAL_COMMUNITY): Payer: Self-pay

## 2019-01-17 DIAGNOSIS — Z8759 Personal history of other complications of pregnancy, childbirth and the puerperium: Secondary | ICD-10-CM

## 2019-01-17 DIAGNOSIS — Z4800 Encounter for change or removal of nonsurgical wound dressing: Secondary | ICD-10-CM | POA: Diagnosis not present

## 2019-01-17 DIAGNOSIS — IMO0002 Reserved for concepts with insufficient information to code with codable children: Secondary | ICD-10-CM

## 2019-01-17 LAB — URINALYSIS, ROUTINE W REFLEX MICROSCOPIC
Bilirubin Urine: NEGATIVE
Glucose, UA: NEGATIVE mg/dL
Hgb urine dipstick: NEGATIVE
Ketones, ur: NEGATIVE mg/dL
Leukocytes,Ua: NEGATIVE
Nitrite: NEGATIVE
Protein, ur: NEGATIVE mg/dL
Specific Gravity, Urine: 1.011 (ref 1.005–1.030)
pH: 8 (ref 5.0–8.0)

## 2019-01-17 NOTE — Discharge Instructions (Signed)
Incision Care, Adult °An incision is a surgical cut that is made through your skin. Most incisions are closed after surgery. Your incision may be closed with stitches (sutures), staples, skin glue, or adhesive strips. You may need to return to your health care provider to have sutures or staples removed. This may occur several days to several weeks after your surgery. The incision needs to be cared for properly to prevent infection. °How to care for your incision °Incision care ° °· Follow instructions from your health care provider about how to take care of your incision. Make sure you: °? Wash your hands with soap and water before you change the bandage (dressing). If soap and water are not available, use hand sanitizer. °? Change your dressing as told by your health care provider. °? Leave sutures, skin glue, or adhesive strips in place. These skin closures may need to stay in place for 2 weeks or longer. If adhesive strip edges start to loosen and curl up, you may trim the loose edges. Do not remove adhesive strips completely unless your health care provider tells you to do that. °· Check your incision area every day for signs of infection. Check for: °? More redness, swelling, or pain. °? More fluid or blood. °? Warmth. °? Pus or a bad smell. °· Ask your health care provider how to clean the incision. This may include: °? Using mild soap and water. °? Using a clean towel to pat the incision dry after cleaning it. °? Applying a cream or ointment. Do this only as told by your health care provider. °? Covering the incision with a clean dressing. °· Ask your health care provider when you can leave the incision uncovered. °· Do not take baths, swim, or use a hot tub until your health care provider approves. Ask your health care provider if you can take showers. You may only be allowed to take sponge baths for bathing. °Medicines °· If you were prescribed an antibiotic medicine, cream, or ointment, take or apply the  antibiotic as told by your health care provider. Do not stop taking or applying the antibiotic even if your condition improves. °· Take over-the-counter and prescription medicines only as told by your health care provider. °General instructions °· Limit movement around your incision to improve healing. °? Avoid straining, lifting, or exercise for the first month, or for as long as told by your health care provider. °? Follow instructions from your health care provider about returning to your normal activities. °? Ask your health care provider what activities are safe. °· Protect your incision from the sun when you are outside for the first 6 months, or for as long as told by your health care provider. Apply sunscreen around the scar or cover it up. °· Keep all follow-up visits as told by your health care provider. This is important. °Contact a health care provider if: °· Your have more redness, swelling, or pain around the incision. °· You have more fluid or blood coming from the incision. °· Your incision feels warm to the touch. °· You have pus or a bad smell coming from the incision. °· You have a fever or shaking chills. °· You are nauseous or you vomit. °· You are dizzy. °· Your sutures or staples come undone. °Get help right away if: °· You have a red streak coming from your incision. °· Your incision bleeds through the dressing and the bleeding does not stop with gentle pressure. °· The edges of   your incision open up and separate. °· You have severe pain. °· You have a rash. °· You are confused. °· You faint. °· You have trouble breathing and a fast heartbeat. °This information is not intended to replace advice given to you by your health care provider. Make sure you discuss any questions you have with your health care provider. °Document Released: 04/24/2005 Document Revised: 06/12/2016 Document Reviewed: 04/22/2016 °Elsevier Interactive Patient Education © 2019 [employer] ° °

## 2019-01-17 NOTE — MAU Note (Signed)
Mary Cortez is a 33 y.o. here in MAU reporting: states she had an ectopic pregnancy removed on march 20, states today she noticed that the left side of the incision is a little bit open. States she has been having some odor with the incision since the 2nd week after the surgery. States she has been cleaning the area as recommended upon discharge  Onset of complaint: today  Pain score: 0/10  Vitals:   01/17/19 1525  BP: 124/80  Pulse: 91  Resp: 18  Temp: 98.6 F (37 C)  SpO2: 100%      Lab orders placed from triage: UA

## 2019-01-17 NOTE — MAU Provider Note (Signed)
History     CSN: [id number]  Arrival date and time: 01/17/19 1444   First Provider Initiated Contact with Patient 01/17/19 1527      Chief Complaint  Patient presents with  . Incision Infection   HPI Mary Cortez is a 33 y.o. [phone] patient who presents to MAU for evaluation of her abdominal incision. She is s/p exploratory laparotomy and right cornual wedge resection for ectopic pregnancy 12/23/2018. Patient states she has noticed discomfort and mild odor at a slightly open point of her incision on the left side of her abdomen. This problem was first noted two weeks ago. She denies fever, pain, tenderness, purulent drainage or recent illness.  She has been cleaning her wound with soap and water as instructed at discharge. She has also been applying liquid aloe vera to her incision. Patient verbalizes that she and her fiance are very "squeamish" about incision care and they have been afraid to thoroughly inspect or clean and dry her wound due to these feelings.  OB History    Gravida  4   Para  1   Term  1   Preterm      AB  3   Living  1     SAB  1   TAB      Ectopic  2   Multiple      Live Births  1           Past Medical History:  Diagnosis Date  . Anemia   . Ectopic pregnancy    right. treated with multidose MTx    Past Surgical History:  Procedure Laterality Date  . CESAREAN SECTION    . CYSTECTOMY    . DIAGNOSTIC LAPAROSCOPY WITH REMOVAL OF ECTOPIC PREGNANCY N/A 12/23/2018   Procedure: DIAGNOSTIC LAPAROSCOPY WITH REMOVAL OF ECTOPIC PREGNANCY;  Surgeon: Zillah Bing, MD;  Location: [facility] OR;  Service: Gynecology;  Laterality: N/A;  . LAPAROTOMY N/A 12/23/2018   Procedure: Exploratory Laparotomy for removal of right corneal ectopic pregnancy;  Surgeon:  Bing, MD;  Location: [facility] OR;  Service: Gynecology;  Laterality: N/A;  . TONSILLECTOMY      History reviewed. No pertinent family history.  Social History   Tobacco Use  . Smoking  status: Never Smoker  . Smokeless tobacco: Never Used  Substance Use Topics  . Alcohol use: No  . Drug use: No    Allergies: No Known Allergies  Medications Prior to Admission  Medication Sig Dispense Refill Last Dose  . oxyCODONE-acetaminophen (PERCOCET/ROXICET) 5-325 MG tablet Take 1-2 tablets by mouth every 6 (six) hours as needed. 30 tablet 0   . Prenatal Vit-Fe Fumarate-FA (PRENATAL MULTIVITAMIN) TABS tablet Take 1 tablet by mouth daily.   Past Week at Unknown time    Review of Systems  Constitutional: Negative for chills, fatigue and fever.  Respiratory: Negative for shortness of breath.   Gastrointestinal: Negative for abdominal pain.  Genitourinary: Negative for difficulty urinating, dysuria, flank pain, pelvic pain, vaginal bleeding, vaginal discharge and vaginal pain.  Musculoskeletal: Negative for back pain.  All other systems reviewed and are negative.  Physical Exam   Blood pressure 124/80, pulse 91, temperature 98.6 F (37 C), temperature source Oral, resp. rate 18, height 5\' 3"  (1.6 m), weight 97.3 kg, SpO2 100 %, unknown if currently breastfeeding.  Physical Exam  Nursing note and vitals reviewed. Constitutional: She appears well-developed and well-nourished.  Cardiovascular: Normal rate.  Respiratory: Effort normal. No respiratory distress.  GI: Soft. She exhibits no distension.  There is no abdominal tenderness. There is no rigidity, no rebound, no guarding, no tenderness at McBurney's point and negative Murphy's sign.  No tenderness to deep palpation  Skin: Skin is warm and dry.  2-25mm open area noted in panus. Scant clear liquid proximal to open skin supicious for aloe vera. Mild bruising surrounding incision. No streaking, erythema, or purulent drainage noted. Entire panus contains dry and flaky skin. Red and dry areas concerning for excoriation    MAU Course/MDM  Procedures    Patient Vitals for the past 24 hrs:  BP Temp Temp src Pulse Resp SpO2  Height Weight  01/17/19 1525 124/80 98.6 F (37 C) Oral 91 18 100 % - -  01/17/19 1456 - - - - - - 5\' 3"  (1.6 m) 97.3 kg     Assessment and Plan  --33 y.o. [id number]  --Wound healing appropriately, no sign of infection --Patient to discontinue use of aloe vera. Adhere to fragrance-free soap and water as previously advised.  --Discussed ensuring her incision is completely dry by retracting her panus and using hair dryer on cool setting, 10-12 inches from incision if she has difficulty getting skin completely dry with only a towel. --Discharge home in stable condition. Call clinic PRN for questions regarding wound healing  F/U: Patient has GYN appointment/surgical follow-up scheduled for 04/15 at [facility], CNM 01/17/2019, 4:02 PM

## 2019-01-18 ENCOUNTER — Other Ambulatory Visit: Payer: Self-pay | Admitting: Obstetrics and Gynecology

## 2019-01-18 ENCOUNTER — Telehealth: Payer: Self-pay | Admitting: *Deleted

## 2019-01-18 DIAGNOSIS — B372 Candidiasis of skin and nail: Secondary | ICD-10-CM

## 2019-01-18 LAB — BETA HCG QUANT (REF LAB): hCG Quant: 5 m[IU]/mL

## 2019-01-18 MED ORDER — CLOTRIMAZOLE 1 % EX OINT: 1.0000 "application " | TOPICAL_OINTMENT | Freq: Two times a day (BID) | CUTANEOUS | 0 refills | Status: DC

## 2019-01-18 NOTE — Telephone Encounter (Signed)
Called [place] per Dr. Vergie Living request and advised of his reccomendations and RX. She voices understanding.

## 2019-01-18 NOTE — Telephone Encounter (Signed)
-----   Message from [place] Bing, MD sent at 01/18/2019  5:41 AM EDT ----- Can you let her know that beta hcg from triage is back to normal so she doesn't need anymore. Also, please let her know that her incision, based on the picture yesterday, looks like she may have a yeast infection and that I'll send in some cream for her to the pharmacy. Thanks!

## 2019-01-20 ENCOUNTER — Telehealth: Payer: Self-pay

## 2019-01-20 NOTE — Telephone Encounter (Signed)
Pt called stating that she was prescribed a cream for a yeast infection in her incision however [facility] does not have in stock and they keep her that it will arrive on this day and then it doesn't.  What does she need to do?

## 2019-01-23 NOTE — Telephone Encounter (Signed)
Returned pt call regarding the cream for her yeast infection.  States she picked up OTC clotrimazole because the pharmacy did not have her prescription.  Pt reports symptoms are improving with the OTC clotrimazole and pt was wondering if anything else could be done about the small sore near the incision.  Spoke with Thressa Sheller CNM regarding this and she recommended that pt not put anything on it at this time but ensure that the site is clean and dry.  Pt verbalized understanding.

## 2019-02-01 ENCOUNTER — Ambulatory Visit (INDEPENDENT_AMBULATORY_CARE_PROVIDER_SITE_OTHER): Payer: BLUE CROSS/BLUE SHIELD | Admitting: Obstetrics and Gynecology

## 2019-02-01 ENCOUNTER — Encounter: Payer: Self-pay | Admitting: Obstetrics and Gynecology

## 2019-02-01 ENCOUNTER — Telehealth: Payer: Self-pay | Admitting: Obstetrics and Gynecology

## 2019-02-01 DIAGNOSIS — Z9889 Other specified postprocedural states: Secondary | ICD-10-CM

## 2019-02-01 DIAGNOSIS — Z09 Encounter for follow-up examination after completed treatment for conditions other than malignant neoplasm: Secondary | ICD-10-CM

## 2019-02-01 NOTE — Progress Notes (Signed)
Center for [facility] 02/01/2019  CC: regular postop check  Subjective:    S/p 3/6 diagnostic lap convered to ex-lap for right cornual ectopic. Pt discharged on pod#2.  Routine post op course. Period just ending.   Review of Systems Pertinent items are noted in HPI.    Objective:    LMP 11/01/2018  NAD Soft, nttp, nd. Well healed incision. ?left side incision with candida   Assessment:    Doing well postoperatively. Operative findings again reviewed. Pathology report discussed.    Plan:   Pt wants to do condoms. D/w her that if +pregnancy in the future to let OB office know asap  Clotrimazole cream sent in  [place] Bing, Montez Hageman MD Attending Center for [facility])

## 2019-02-01 NOTE — Telephone Encounter (Signed)
Stated she has not received a phone call for the virtual visit. Informed the nurse is currently working on the list and will contact her shortly.

## 2019-02-06 ENCOUNTER — Encounter: Payer: Self-pay | Admitting: Obstetrics and Gynecology

## 2019-11-07 ENCOUNTER — Encounter (INDEPENDENT_AMBULATORY_CARE_PROVIDER_SITE_OTHER): Payer: Self-pay

## 2019-12-18 ENCOUNTER — Encounter: Payer: Self-pay | Admitting: Obstetrics and Gynecology

## 2019-12-18 ENCOUNTER — Ambulatory Visit: Payer: 59 | Admitting: Obstetrics and Gynecology

## 2019-12-18 ENCOUNTER — Other Ambulatory Visit: Payer: Self-pay

## 2019-12-18 VITALS — BP 136/90 | HR 87 | Wt 213.0 lb

## 2019-12-18 DIAGNOSIS — Z8041 Family history of malignant neoplasm of ovary: Secondary | ICD-10-CM

## 2019-12-18 DIAGNOSIS — Z124 Encounter for screening for malignant neoplasm of cervix: Secondary | ICD-10-CM

## 2019-12-18 DIAGNOSIS — Z1151 Encounter for screening for human papillomavirus (HPV): Secondary | ICD-10-CM | POA: Diagnosis not present

## 2019-12-18 DIAGNOSIS — Z803 Family history of malignant neoplasm of breast: Secondary | ICD-10-CM

## 2019-12-18 DIAGNOSIS — Z01419 Encounter for gynecological examination (general) (routine) without abnormal findings: Secondary | ICD-10-CM | POA: Diagnosis not present

## 2019-12-18 DIAGNOSIS — R030 Elevated blood-pressure reading, without diagnosis of hypertension: Secondary | ICD-10-CM

## 2019-12-18 NOTE — Progress Notes (Signed)
Obstetrics and Gynecology Established Patient Evaluation  Appointment Date: 12/18/2019  OBGYN Clinic: [facility]  Primary Care Provider: Deboraha Sprang (Dr. Chanetta Marshall)  Chief Complaint:  Chief Complaint  Patient presents with  . Gynecologic Exam    History of Present Illness: Mary Cortez is a 34 y.o. African-American [id number] (No LMP recorded.), seen for the above chief complaint. Her past medical history is significant for h/o right cornual ectopic s/p resection, h/o c-section, BMI 30s.  No issues or problems today    Review of Systems: Pertinent items noted in HPI and remainder of comprehensive ROS otherwise negative.   Patient Active Problem List   Diagnosis Date Noted  . Family history of ovarian cancer 12/18/2019  . Family history of breast cancer 12/18/2019  . Transient hypertension 12/18/2019  . History of genetic counseling     Past Medical History:  Past Medical History:  Diagnosis Date  . Anemia   . Ectopic pregnancy    right. treated with multidose MTx  . History of ectopic pregnancy 12/23/2018    Past Surgical History:  Past Surgical History:  Procedure Laterality Date  . CESAREAN SECTION    . CYSTECTOMY    . DIAGNOSTIC LAPAROSCOPY WITH REMOVAL OF ECTOPIC PREGNANCY N/A 12/23/2018   Procedure: DIAGNOSTIC LAPAROSCOPY WITH REMOVAL OF ECTOPIC PREGNANCY;  Surgeon: [place] Bing, MD;  Location: [facility] OR;  Service: Gynecology;  Laterality: N/A;  . LAPAROTOMY N/A 12/23/2018   Procedure: Exploratory Laparotomy for removal of right corneal ectopic pregnancy;  Surgeon: Jarr[place] Bing, MD;  Location: [facility] OR;  Service: Gynecology;  Laterality: N/A;  . TONSILLECTOMY      Past Obstetrical History:  OB History  Gravida Para Term Preterm AB Living  4 1 1   3 1   SAB TAB Ectopic Multiple Live Births  1   2   1     # Outcome Date GA Lbr Len/2nd Weight Sex Delivery Anes PTL Lv  4 Term 01/2017     CS-LTranv   LIV  3 Ectopic 2016          2 SAB 2007           1 Ectopic             Past Gynecological History: As per HPI. Periods: qmonth, regular, not particularly heavy or painful History of Pap Smear(s): unknown She is currently using no method for contraception.   Social History:  Social History   Socioeconomic History  . Marital status: Significant Other    Spouse name: Not on file  . Number of children: Not on file  . Years of education: Not on file  . Highest education level: Not on file  Occupational History  . Not on file  Tobacco Use  . Smoking status: Never Smoker  . Smokeless tobacco: Never Used  Substance and Sexual Activity  . Alcohol use: No  . Drug use: No  . Sexual activity: Yes    Birth control/protection: None  Other Topics Concern  . Not on file  Social History Narrative  . Not on file   Social Determinants of Health   Financial Resource Strain:   . Difficulty of Paying Living Expenses: Not on file  Food Insecurity:   . Worried About 2017 in the Last Year: Not on file  . Ran Out of Food in the Last Year: Not on file  Transportation Needs:   . Lack of Transportation (Medical): Not on file  . Lack of Transportation (Non-Medical): Not  on file  Physical Activity:   . Days of Exercise per Week: Not on file  . Minutes of Exercise per Session: Not on file  Stress:   . Feeling of Stress : Not on file  Social Connections:   . Frequency of Communication with Friends and Family: Not on file  . Frequency of Social Gatherings with Friends and Family: Not on file  . Attends Religious Services: Not on file  . Active Member of Clubs or Organizations: Not on file  . Attends Archivist Meetings: Not on file  . Marital Status: Not on file  Intimate Partner Violence:   . Fear of Current or Ex-Partner: Not on file  . Emotionally Abused: Not on file  . Physically Abused: Not on file  . Sexually Abused: Not on file    Family History: No family history on file. See problem  list   Medications Kayana D. Graber had no medications administered during this visit. Current Outpatient Medications  Medication Sig Dispense Refill  . Clotrimazole 1 % OINT Apply 1 application topically 2 (two) times daily. For 10-14 days 1 Tube 0   No current facility-administered medications for this visit.    Allergies Patient has no known allergies.   Physical Exam:  BP 136/90   Pulse 87   Wt 213 lb (96.6 kg)   BMI 37.73 kg/m  Body mass index is 37.73 kg/m. General appearance: Well nourished, well developed female in no acute distress.  Neck:  Supple, normal appearance, and no thyromegaly  Cardiovascular: normal s1 and s2.  No murmurs, rubs or gallops. Respiratory:  Clear to auscultation bilateral. Normal respiratory effort Abdomen: positive bowel sounds and no masses, hernias; diffusely non tender to palpation, non distended Breasts: breasts appear normal, no suspicious masses, no skin or nipple changes or axillary nodes, and negative palpation. Neuro/Psych:  Normal mood and affect.  Skin:  Warm and dry.  Lymphatic:  No inguinal lymphadenopathy.   Pelvic exam: is not limited by body habitus EGBUS: within normal limits Vagina: within normal limits and with no blood or discharge in the vault Cervix: normal appearing cervix without tenderness, discharge or lesions.  Uterus:  nonenlarged and non tender Adnexa:  normal adnexa and no mass, fullness, tenderness Rectovaginal: deferred  Laboratory: none  Radiology: none  Assessment: pt doing well  Plan: 1. Transient hypertension 128/90 on repeat. Follow up with PCP  2. Family history of breast cancer GC d/w her and information given to patient and pt to consider  3. Family history of ovarian cancer  4. Women's annual routine gynecological examination Pt interested in trying to conceive again. D/w her that it's fine. I recommend starting a women's multivitamin and once she becomes pregnant to let us know asap so  we can follow blood levels and do early u/s given risk of rpt ectopic - Cytology - PAP( Vici)  RTC 1 year  Durene Romans MD Attending Center for [employer] Fish farm manager)

## 2019-12-20 LAB — CYTOLOGY - PAP
Comment: NEGATIVE
Diagnosis: UNDETERMINED — AB
High risk HPV: NEGATIVE

## 2020-06-04 IMAGING — CR DG OR LOCAL ABDOMEN
8 of 9 series · 8 of 9 positions shown · non-contrast
Comparison: None.

CLINICAL DATA: Incorrect instrument count

EXAM:
OR LOCAL ABDOMEN

[AP (1 of 8)]
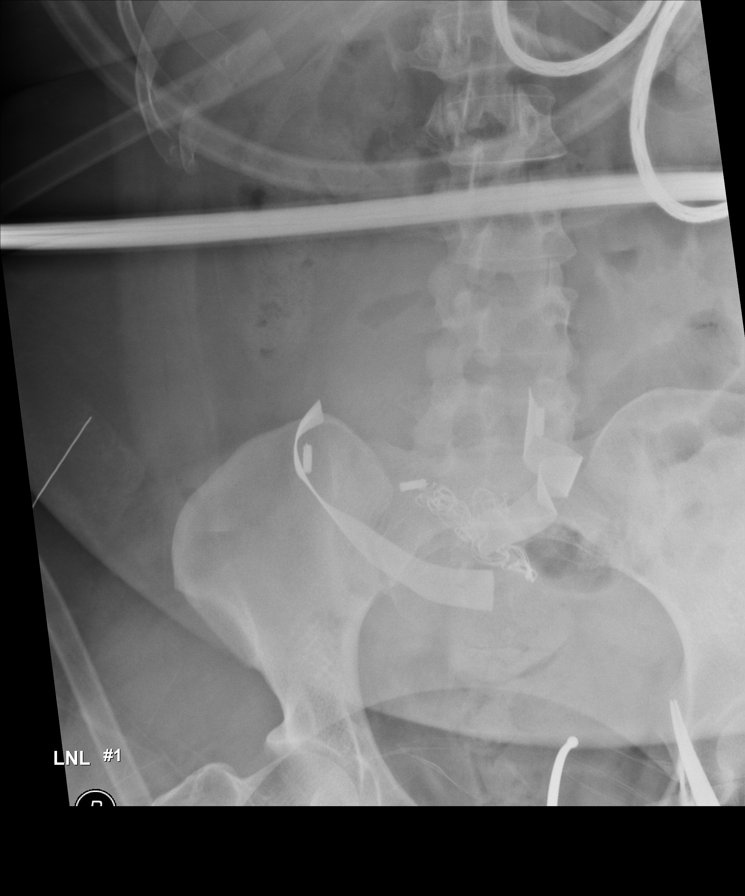

[AP (2 of 8)]
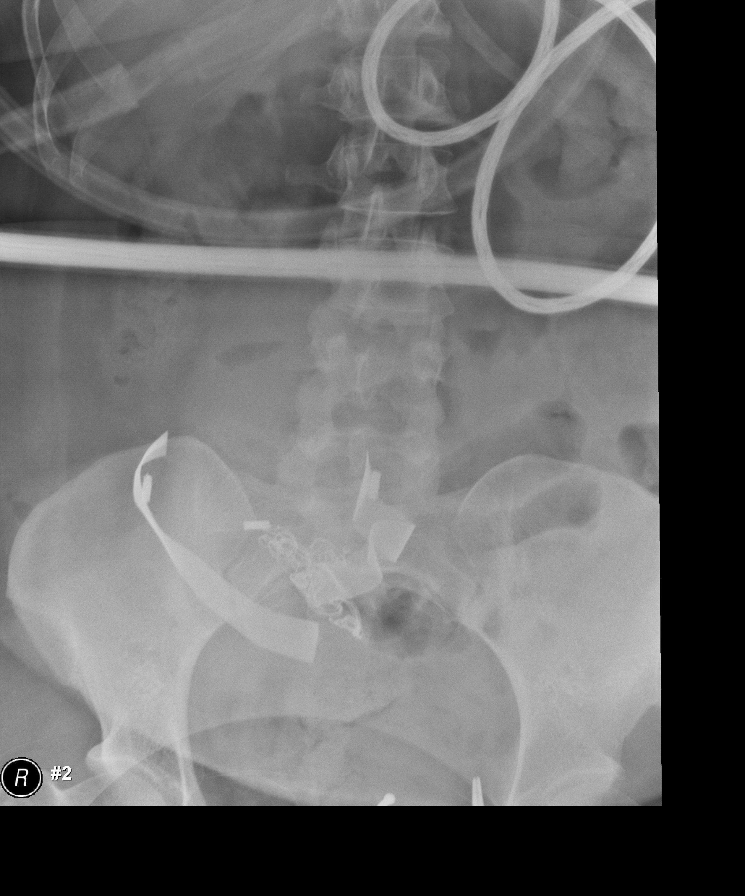

[AP (3 of 8)]
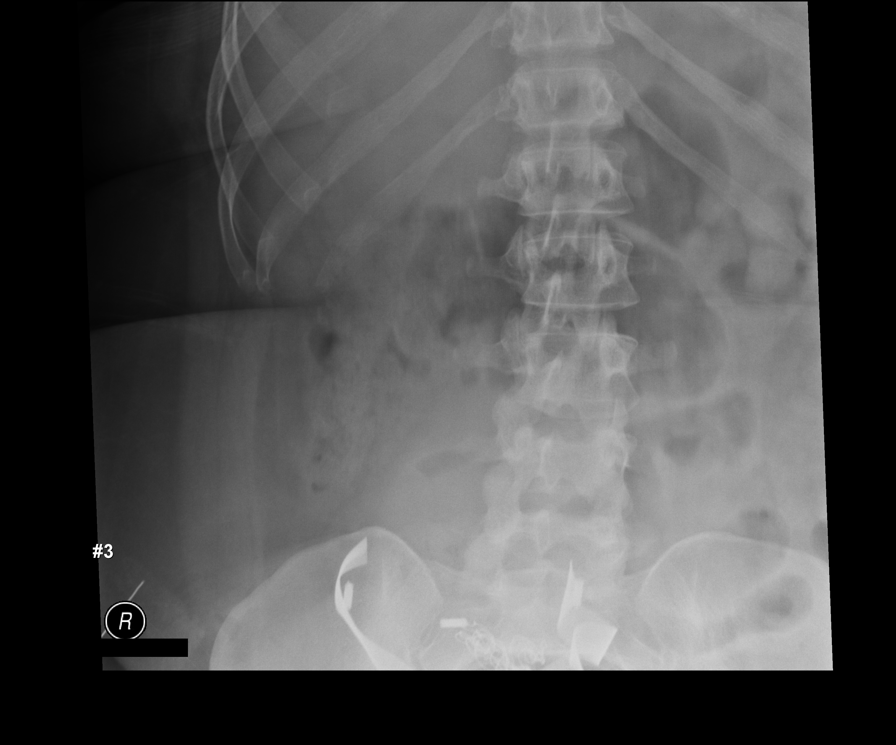

[AP (4 of 8)]
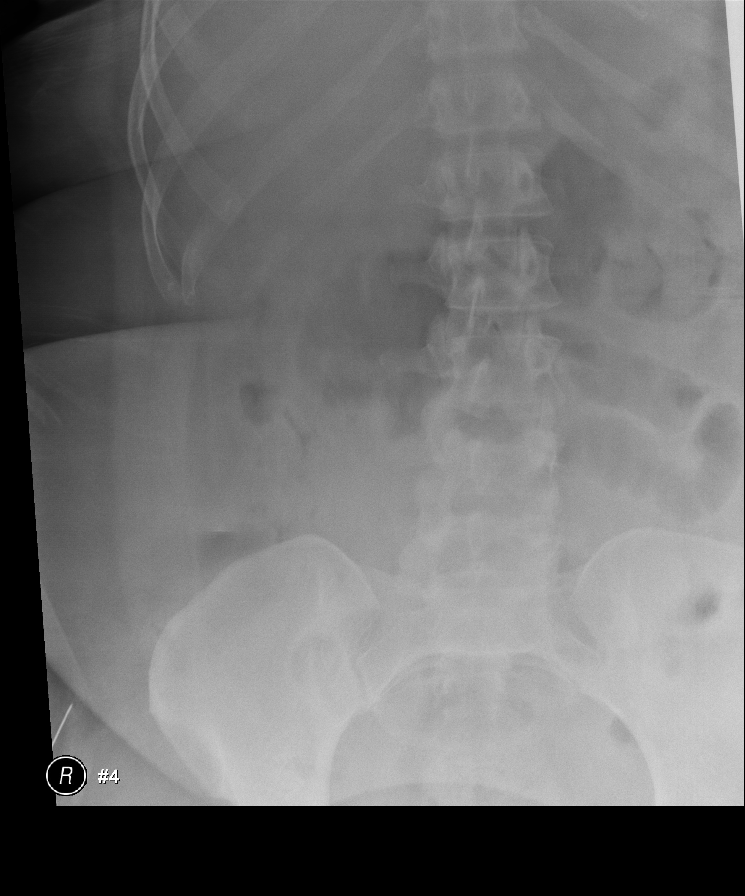

[AP (5 of 8)]
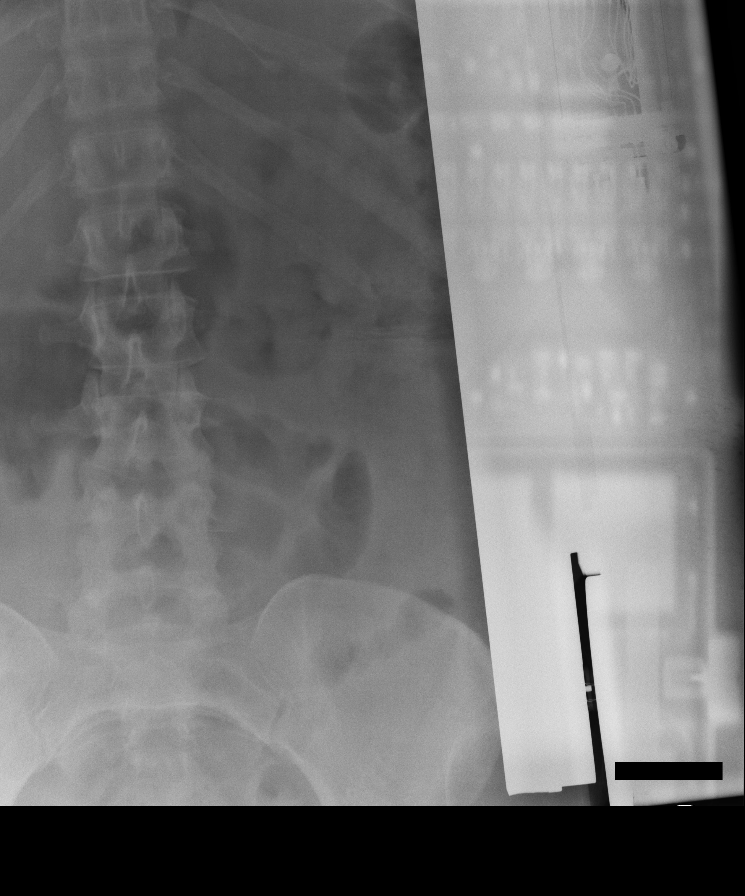

[AP (6 of 8)]
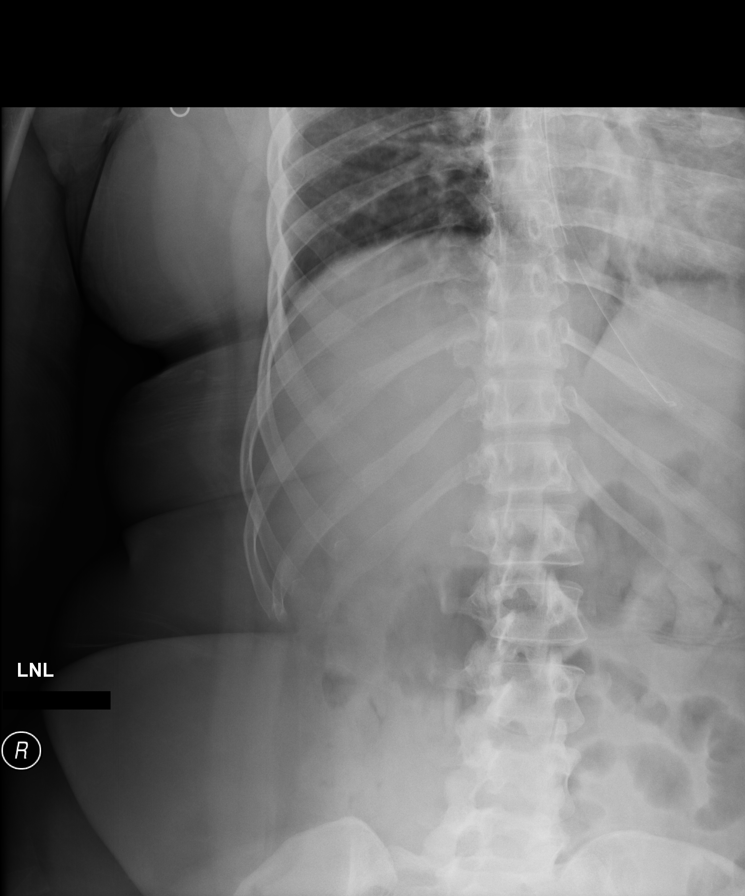

[AP (7 of 8)]
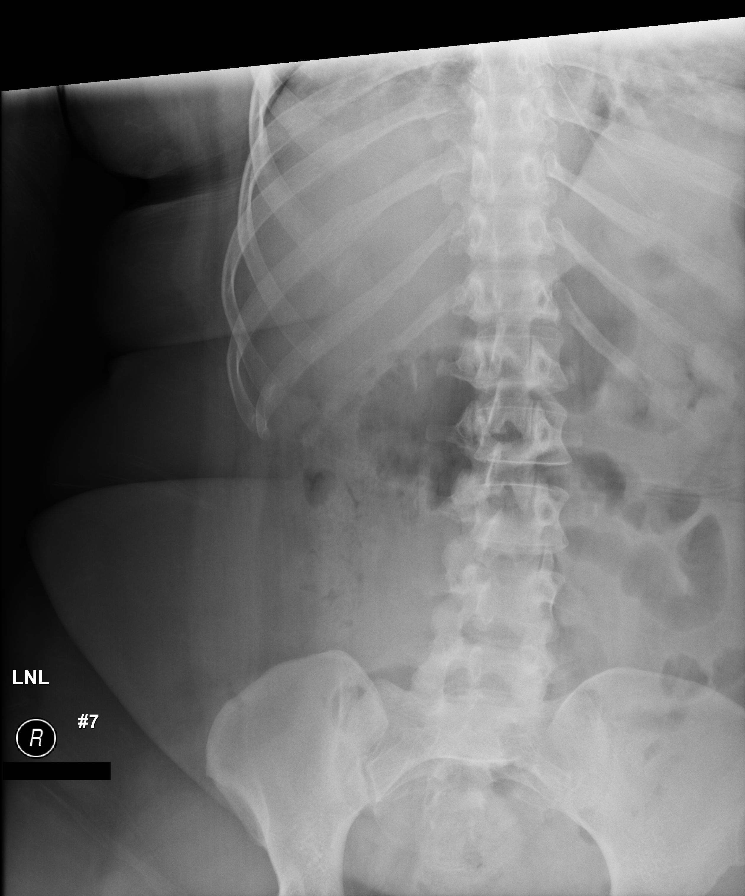

[AP (8 of 8)]
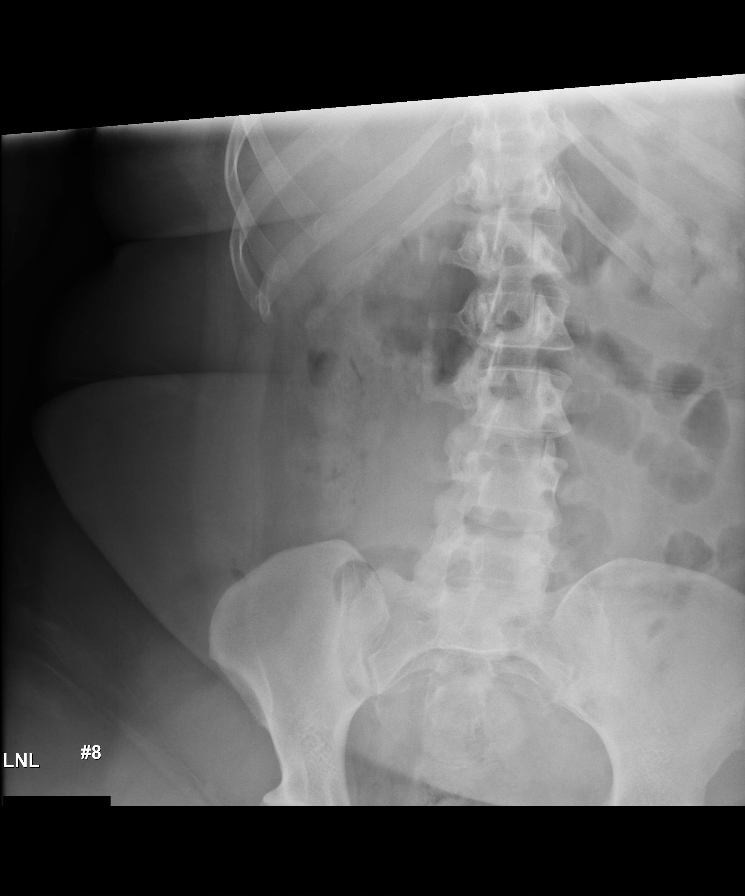

[8 of 9 positions shown; findings below may reference images not displayed]

FINDINGS: Nine intraoperative radiographs are provided. On images 1-3, there
are radiopaque markers overlying the sacrum, consistent with
laparotomy pads. These are removed on images 4-9. There is no other
radiopaque foreign body projecting over the abdomen and pelvis.
There is an orogastric tube its tip at the gastroesophageal
junction.
IMPRESSION: No retained radiopaque foreign body, following removal of laparotomy
pads between images 3 and 4.

These results were called by telephone at the time of interpretation
on 12/23/2018 at [DATE] to Dr. FALLON JIM , who verbally
acknowledged these results.

## 2020-12-30 ENCOUNTER — Ambulatory Visit: Payer: 59 | Admitting: Obstetrics & Gynecology

## 2021-10-21 DIAGNOSIS — M129 Arthropathy, unspecified: Secondary | ICD-10-CM | POA: Diagnosis not present

## 2021-10-21 DIAGNOSIS — M791 Myalgia, unspecified site: Secondary | ICD-10-CM | POA: Diagnosis not present

## 2021-11-24 DIAGNOSIS — E7800 Pure hypercholesterolemia, unspecified: Secondary | ICD-10-CM | POA: Diagnosis not present

## 2021-11-24 DIAGNOSIS — Z0000 Encounter for general adult medical examination without abnormal findings: Secondary | ICD-10-CM | POA: Diagnosis not present

## 2021-12-11 ENCOUNTER — Ambulatory Visit: Payer: Self-pay

## 2021-12-11 ENCOUNTER — Ambulatory Visit: Payer: Self-pay | Admitting: Obstetrics and Gynecology

## 2022-02-17 ENCOUNTER — Encounter: Payer: Self-pay | Admitting: Obstetrics and Gynecology

## 2022-02-17 ENCOUNTER — Ambulatory Visit (INDEPENDENT_AMBULATORY_CARE_PROVIDER_SITE_OTHER): Payer: BC Managed Care – PPO | Admitting: Obstetrics and Gynecology

## 2022-02-17 DIAGNOSIS — N943 Premenstrual tension syndrome: Secondary | ICD-10-CM | POA: Diagnosis not present

## 2022-02-17 MED ORDER — DROSPIRENONE-ETHINYL ESTRADIOL 3-0.02 MG PO TABS: 1.0000 | ORAL_TABLET | Freq: Every day | ORAL | 11 refills | Status: DC

## 2022-02-17 NOTE — Progress Notes (Signed)
Mary Cortez presents to discuss Tx options for her PMS. ?She reports PMS for the last several yrs. ?Moody, body aches, acne, N/V, cramps and clots. ?Sexual active without contraception ?Pap smear UTD ?PCP checked thyroid in the last yr and was normal per pt report ? ?PE AF VSS ?Lungs clear Heart RRR ?Abd soft, + BS ? ? ?A/P PMS ? ?Tx options reviewed with pt. Including behavior and diet modification. SSRI's and COC's. ?R/B of each reviewed. Following discussion, pt elects to try COC's.  Rx to pharmacy. ?F/U in 4 months ?

## 2022-02-17 NOTE — Progress Notes (Signed)
Patient wants to discuss her options for PMS. She stated that her symptoms of pms consist of nausea, acne, severe leg and breast pain  ?

## 2022-02-17 NOTE — Patient Instructions (Addendum)
Premenstrual Syndrome ?Premenstrual syndrome (PMS) is a group of physical, emotional, and behavioral symptoms that affect women as part of their menstrual cycle. PMS occurs 1-2 weeks before the start of a woman's menstrual period and goes away a few days after menstrual bleeding begins. PMS can range from mild to severe. ?What are the causes? ?The exact cause of this condition is not known, but it seems to be related to hormone changes that happen before menstruation. ?What are the signs or symptoms? ?Symptoms of this condition often happen every month. They go away after your period starts. Physical symptoms of this condition include: ?Bloating. ?Breast pain or tenderness. ?Headaches. ?Extreme fatigue. ?Backaches. ?Swelling of the hands and feet. ?Weight gain. ?Hot flashes. ?Emotional symptoms of this condition include: ?Mood swings. ?Depression. ?Angry or hostile outbursts. ?Irritability. ?Anxiety. ?Crying spells. ?Behavioral symptoms include: ?Food cravings or appetite changes. ?Changes in sexual desire. ?Confusion. ?Social withdrawal. ?Poor concentration. ?How is this diagnosed? ?This condition may be diagnosed based on a history of your symptoms. This condition is generally diagnosed if symptoms of PMS: ?Are present in the 5 days before your period starts. ?End within 4 days after your period starts. ?Happen at least 3 months in a row. ?Interfere with some of your normal activities. ?Other conditions that can cause some of these symptoms must be ruled out before PMS can be diagnosed. These include depression, anxiety, anemia, and thyroid problems. ?How is this treated? ?This condition may be treated by doing the following: ?Maintaining a healthy lifestyle. This includes eating a well-balanced diet and exercising regularly. ?Taking over-the-counter medicines that can help relieve symptoms, such as cramps, aches, pain, headaches, and breast tenderness. ?Follow these instructions at home: ?Eating and  drinking ? ?Eat a well-balanced diet. ?Avoid caffeine and alcohol. ?Limit the amount of salt and salty foods you eat. This will help reduce bloating. ?Drink enough fluid to keep your urine pale yellow. ?Take a multivitamin if told to do so by your health care provider. ?Lifestyle ? ?Do not use any products that contain nicotine or tobacco. These products include cigarettes, chewing tobacco, and vaping devices, such as e-cigarettes. If you need help quitting, ask your health care provider. ?Exercise regularly as suggested by your health care provider. ?Get enough sleep. For most adults, this is 7-8 hours of sleep each night. ?Practice relaxation techniques, such as yoga, tai chi, or meditation. ?Find healthy ways to manage stress. ?General instructions ? ?For 2-3 months, write down your symptoms, whether they are mild to severe, and how long they last. This will help your health care provider choose the best treatment for you. ?Take over-the-counter and prescription medicines only as told by your health care provider. ?If you are using birth control pills (oral contraceptives), use them as told by your health care provider. ?Contact a health care provider if: ?Your symptoms get worse. ?You develop new symptoms. ?You have trouble doing your daily activities. ?Summary ?Premenstrual syndrome (PMS) is a group of physical, emotional, and behavioral symptoms that affect women as part of their menstrual cycle. ?PMS starts 1-2 weeks before the start of a woman's period and goes away a few days after the period starts. ?PMS is treated by maintaining a healthy lifestyle and taking medicines to relieve the symptoms. ?This information is not intended to replace advice given to you by your health care provider. Make sure you discuss any questions you have with your health care provider. ?Document Revised: 05/24/2020 Document Reviewed: 05/24/2020 ?Elsevier Patient Education ? [place]. ?Health  Maintenance,  Female ?Adopting a healthy lifestyle and getting preventive care are important in promoting health and wellness. Ask your health care provider about: ?The right schedule for you to have regular tests and exams. ?Things you can do on your own to prevent diseases and keep yourself healthy. ?What should I know about diet, weight, and exercise? ?Eat a healthy diet ? ?Eat a diet that includes plenty of vegetables, fruits, low-fat dairy products, and lean protein. ?Do not eat a lot of foods that are high in solid fats, added sugars, or sodium. ?Maintain a healthy weight ?Body mass index (BMI) is used to identify weight problems. It estimates body fat based on height and weight. Your health care provider can help determine your BMI and help you achieve or maintain a healthy weight. ?Get regular exercise ?Get regular exercise. This is one of the most important things you can do for your health. Most adults should: ?Exercise for at least 150 minutes each week. The exercise should increase your heart rate and make you sweat (moderate-intensity exercise). ?Do strengthening exercises at least twice a week. This is in addition to the moderate-intensity exercise. ?Spend less time sitting. Even light physical activity can be beneficial. ?Watch cholesterol and blood lipids ?Have your blood tested for lipids and cholesterol at 37 years of age, then have this test every 5 years. ?Have your cholesterol levels checked more often if: ?Your lipid or cholesterol levels are high. ?You are older than 37 years of age. ?You are at high risk for heart disease. ?What should I know about cancer screening? ?Depending on your health history and family history, you may need to have cancer screening at various ages. This may include screening for: ?Breast cancer. ?Cervical cancer. ?Colorectal cancer. ?Skin cancer. ?Lung cancer. ?What should I know about heart disease, diabetes, and high blood pressure? ?Blood pressure and heart disease ?High blood  pressure causes heart disease and increases the risk of stroke. This is more likely to develop in people who have high blood pressure readings or are overweight. ?Have your blood pressure checked: ?Every 3-5 years if you are 51-15 years of age. ?Every year if you are 10 years old or older. ?Diabetes ?Have regular diabetes screenings. This checks your fasting blood sugar level. Have the screening done: ?Once every three years after age 35 if you are at a normal weight and have a low risk for diabetes. ?More often and at a younger age if you are overweight or have a high risk for diabetes. ?What should I know about preventing infection? ?Hepatitis B ?If you have a higher risk for hepatitis B, you should be screened for this virus. Talk with your health care provider to find out if you are at risk for hepatitis B infection. ?Hepatitis C ?Testing is recommended for: ?Everyone born from [birth year] through 1966. ?Anyone with known risk factors for hepatitis C. ?Sexually transmitted infections (STIs) ?Get screened for STIs, including gonorrhea and chlamydia, if: ?You are sexually active and are younger than 37 years of age. ?You are older than 37 years of age and your health care provider tells you that you are at risk for this type of infection. ?Your sexual activity has changed since you were last screened, and you are at increased risk for chlamydia or gonorrhea. Ask your health care provider if you are at risk. ?Ask your health care provider about whether you are at high risk for HIV. Your health care provider may recommend a prescription medicine to help prevent HIV  infection. If you choose to take medicine to prevent HIV, you should first get tested for HIV. You should then be tested every 3 months for as long as you are taking the medicine. ?Pregnancy ?If you are about to stop having your period (premenopausal) and you may become pregnant, seek counseling before you get pregnant. ?Take 400 to 800 micrograms (mcg) of folic  acid every day if you become pregnant. ?Ask for birth control (contraception) if you want to prevent pregnancy. ?Osteoporosis and menopause ?Osteoporosis is a disease in which the bones lose minerals and strength with aging. Thi

## 2022-02-23 DIAGNOSIS — M797 Fibromyalgia: Secondary | ICD-10-CM | POA: Diagnosis not present

## 2022-02-23 DIAGNOSIS — M546 Pain in thoracic spine: Secondary | ICD-10-CM | POA: Diagnosis not present

## 2022-02-23 DIAGNOSIS — Z6838 Body mass index (BMI) 38.0-38.9, adult: Secondary | ICD-10-CM | POA: Diagnosis not present

## 2022-02-23 DIAGNOSIS — M25552 Pain in left hip: Secondary | ICD-10-CM | POA: Diagnosis not present

## 2022-02-23 DIAGNOSIS — M542 Cervicalgia: Secondary | ICD-10-CM | POA: Diagnosis not present

## 2022-02-23 DIAGNOSIS — E669 Obesity, unspecified: Secondary | ICD-10-CM | POA: Diagnosis not present

## 2022-02-23 DIAGNOSIS — M064 Inflammatory polyarthropathy: Secondary | ICD-10-CM | POA: Diagnosis not present

## 2022-04-08 DIAGNOSIS — M9901 Segmental and somatic dysfunction of cervical region: Secondary | ICD-10-CM | POA: Diagnosis not present

## 2022-04-08 DIAGNOSIS — M9902 Segmental and somatic dysfunction of thoracic region: Secondary | ICD-10-CM | POA: Diagnosis not present

## 2022-04-08 DIAGNOSIS — M62838 Other muscle spasm: Secondary | ICD-10-CM | POA: Diagnosis not present

## 2022-04-08 DIAGNOSIS — M542 Cervicalgia: Secondary | ICD-10-CM | POA: Diagnosis not present

## 2022-04-13 DIAGNOSIS — M9902 Segmental and somatic dysfunction of thoracic region: Secondary | ICD-10-CM | POA: Diagnosis not present

## 2022-04-13 DIAGNOSIS — M9901 Segmental and somatic dysfunction of cervical region: Secondary | ICD-10-CM | POA: Diagnosis not present

## 2022-04-13 DIAGNOSIS — M62838 Other muscle spasm: Secondary | ICD-10-CM | POA: Diagnosis not present

## 2022-04-13 DIAGNOSIS — M542 Cervicalgia: Secondary | ICD-10-CM | POA: Diagnosis not present

## 2022-05-08 ENCOUNTER — Other Ambulatory Visit: Payer: Self-pay

## 2022-05-08 DIAGNOSIS — N943 Premenstrual tension syndrome: Secondary | ICD-10-CM

## 2022-05-08 MED ORDER — DROSPIRENONE-ETHINYL ESTRADIOL 3-0.02 MG PO TABS: 1.0000 | ORAL_TABLET | Freq: Every day | ORAL | 11 refills | Status: DC

## 2022-05-08 NOTE — Progress Notes (Signed)
Call placed to pharmacy per pt request due to needing refills. Spoke with [facility] and pt has refills, but now needs new Rx sent to [facility] for remainder refills. Pt called back and made aware. Will contact Insurance today for 1 override pack to get through until mail order arrives.  Sherilyn Windhorst,RNC

## 2022-08-18 DIAGNOSIS — Z1331 Encounter for screening for depression: Secondary | ICD-10-CM | POA: Diagnosis not present

## 2022-08-18 DIAGNOSIS — F411 Generalized anxiety disorder: Secondary | ICD-10-CM | POA: Diagnosis not present

## 2022-08-18 DIAGNOSIS — F32A Depression, unspecified: Secondary | ICD-10-CM | POA: Diagnosis not present

## 2022-09-01 DIAGNOSIS — F32A Depression, unspecified: Secondary | ICD-10-CM | POA: Diagnosis not present

## 2022-09-01 DIAGNOSIS — F411 Generalized anxiety disorder: Secondary | ICD-10-CM | POA: Diagnosis not present

## 2022-09-04 DIAGNOSIS — J019 Acute sinusitis, unspecified: Secondary | ICD-10-CM | POA: Diagnosis not present

## 2022-09-04 DIAGNOSIS — Z20822 Contact with and (suspected) exposure to covid-19: Secondary | ICD-10-CM | POA: Diagnosis not present

## 2022-09-04 DIAGNOSIS — R059 Cough, unspecified: Secondary | ICD-10-CM | POA: Diagnosis not present

## 2022-09-15 DIAGNOSIS — F411 Generalized anxiety disorder: Secondary | ICD-10-CM | POA: Diagnosis not present

## 2022-09-15 DIAGNOSIS — F32A Depression, unspecified: Secondary | ICD-10-CM | POA: Diagnosis not present

## 2022-09-28 DIAGNOSIS — N3000 Acute cystitis without hematuria: Secondary | ICD-10-CM | POA: Diagnosis not present

## 2022-09-28 DIAGNOSIS — R300 Dysuria: Secondary | ICD-10-CM | POA: Diagnosis not present

## 2022-09-29 DIAGNOSIS — F32A Depression, unspecified: Secondary | ICD-10-CM | POA: Diagnosis not present

## 2022-09-29 DIAGNOSIS — F411 Generalized anxiety disorder: Secondary | ICD-10-CM | POA: Diagnosis not present

## 2022-10-15 DIAGNOSIS — R350 Frequency of micturition: Secondary | ICD-10-CM | POA: Diagnosis not present

## 2022-10-15 DIAGNOSIS — N390 Urinary tract infection, site not specified: Secondary | ICD-10-CM | POA: Diagnosis not present

## 2022-10-27 ENCOUNTER — Other Ambulatory Visit: Payer: Self-pay | Admitting: Family Medicine

## 2022-10-27 ENCOUNTER — Encounter: Payer: Self-pay | Admitting: Family Medicine

## 2022-10-27 DIAGNOSIS — R3129 Other microscopic hematuria: Secondary | ICD-10-CM

## 2022-10-27 DIAGNOSIS — R350 Frequency of micturition: Secondary | ICD-10-CM

## 2022-11-04 ENCOUNTER — Encounter: Payer: Self-pay | Admitting: Family Medicine

## 2022-11-05 ENCOUNTER — Ambulatory Visit
Admission: RE | Admit: 2022-11-05 | Discharge: 2022-11-05 | Disposition: A | Discharge status: Home / Self Care | Payer: BC Managed Care – PPO | Source: Ambulatory Visit | Attending: Family Medicine | Admitting: Family Medicine

## 2022-11-05 DIAGNOSIS — R350 Frequency of micturition: Secondary | ICD-10-CM

## 2022-11-05 DIAGNOSIS — R3129 Other microscopic hematuria: Secondary | ICD-10-CM

## 2022-11-05 MED ORDER — IOPAMIDOL (ISOVUE-300) INJECTION 61%: 100.0000 mL | Freq: Once | INTRAVENOUS | Status: DC | PRN

## 2022-11-12 ENCOUNTER — Ambulatory Visit
Admission: RE | Admit: 2022-11-12 | Discharge: 2022-11-12 | Disposition: A | Discharge status: Home / Self Care | Payer: BC Managed Care – PPO | Source: Ambulatory Visit | Attending: Family Medicine | Admitting: Family Medicine

## 2022-11-12 DIAGNOSIS — R319 Hematuria, unspecified: Secondary | ICD-10-CM | POA: Diagnosis not present

## 2022-11-12 DIAGNOSIS — R350 Frequency of micturition: Secondary | ICD-10-CM | POA: Diagnosis not present

## 2022-11-12 DIAGNOSIS — K429 Umbilical hernia without obstruction or gangrene: Secondary | ICD-10-CM | POA: Diagnosis not present

## 2022-11-12 DIAGNOSIS — K439 Ventral hernia without obstruction or gangrene: Secondary | ICD-10-CM | POA: Diagnosis not present

## 2022-11-12 MED ORDER — IOPAMIDOL (ISOVUE-300) INJECTION 61%
100.0000 mL | Freq: Once | INTRAVENOUS | Status: AC | PRN
  Administered 2022-11-12: 100 mL via INTRAVENOUS

## 2022-12-03 DIAGNOSIS — Z0000 Encounter for general adult medical examination without abnormal findings: Secondary | ICD-10-CM | POA: Diagnosis not present

## 2022-12-03 DIAGNOSIS — R3129 Other microscopic hematuria: Secondary | ICD-10-CM | POA: Diagnosis not present

## 2022-12-03 DIAGNOSIS — E7800 Pure hypercholesterolemia, unspecified: Secondary | ICD-10-CM | POA: Diagnosis not present

## 2022-12-03 DIAGNOSIS — Z79899 Other long term (current) drug therapy: Secondary | ICD-10-CM | POA: Diagnosis not present

## 2022-12-16 ENCOUNTER — Ambulatory Visit: Payer: BC Managed Care – PPO | Admitting: Obstetrics and Gynecology

## 2022-12-16 ENCOUNTER — Encounter: Payer: Self-pay | Admitting: Obstetrics and Gynecology

## 2022-12-17 NOTE — Progress Notes (Signed)
Patient did not keep her GYN annual appointment for 12/16/2022.  Mary Romans MD Attending Center for [employer] Fish farm manager)

## 2023-02-01 DIAGNOSIS — F430 Acute stress reaction: Secondary | ICD-10-CM | POA: Diagnosis not present

## 2023-03-18 ENCOUNTER — Other Ambulatory Visit: Payer: Self-pay

## 2023-03-18 ENCOUNTER — Telehealth: Payer: Self-pay | Admitting: Clinical

## 2023-03-18 ENCOUNTER — Encounter: Payer: Self-pay | Admitting: Obstetrics and Gynecology

## 2023-03-18 ENCOUNTER — Other Ambulatory Visit (HOSPITAL_COMMUNITY)
Admission: RE | Admit: 2023-03-18 | Discharge: 2023-03-18 | Disposition: A | Discharge status: Home / Self Care | Payer: BC Managed Care – PPO | Source: Ambulatory Visit | Attending: Obstetrics and Gynecology | Admitting: Obstetrics and Gynecology

## 2023-03-18 ENCOUNTER — Ambulatory Visit (INDEPENDENT_AMBULATORY_CARE_PROVIDER_SITE_OTHER): Payer: BC Managed Care – PPO | Admitting: Obstetrics and Gynecology

## 2023-03-18 VITALS — BP 123/87 | HR 81 | Ht 63.25 in | Wt 215.9 lb

## 2023-03-18 DIAGNOSIS — Z8742 Personal history of other diseases of the female genital tract: Secondary | ICD-10-CM

## 2023-03-18 DIAGNOSIS — Z113 Encounter for screening for infections with a predominantly sexual mode of transmission: Secondary | ICD-10-CM | POA: Diagnosis not present

## 2023-03-18 DIAGNOSIS — Z01419 Encounter for gynecological examination (general) (routine) without abnormal findings: Secondary | ICD-10-CM

## 2023-03-18 DIAGNOSIS — Z3009 Encounter for other general counseling and advice on contraception: Secondary | ICD-10-CM

## 2023-03-18 DIAGNOSIS — R030 Elevated blood-pressure reading, without diagnosis of hypertension: Secondary | ICD-10-CM

## 2023-03-18 DIAGNOSIS — Z803 Family history of malignant neoplasm of breast: Secondary | ICD-10-CM

## 2023-03-18 DIAGNOSIS — G43821 Menstrual migraine, not intractable, with status migrainosus: Secondary | ICD-10-CM

## 2023-03-18 DIAGNOSIS — Z01411 Encounter for gynecological examination (general) (routine) with abnormal findings: Secondary | ICD-10-CM

## 2023-03-18 DIAGNOSIS — Z8041 Family history of malignant neoplasm of ovary: Secondary | ICD-10-CM

## 2023-03-18 DIAGNOSIS — N943 Premenstrual tension syndrome: Secondary | ICD-10-CM

## 2023-03-18 MED ORDER — SLYND 4 MG PO TABS: 1.0000 | ORAL_TABLET | Freq: Every day | ORAL | 3 refills | Status: AC

## 2023-03-18 NOTE — Telephone Encounter (Signed)
Attempt call regarding referral; Left HIPPA-compliant message to call back Manasseh Pittsley from [facility] at  [phone] (Dorsie Sethi's office); left MyChart message for pt.  

## 2023-03-18 NOTE — Progress Notes (Signed)
Obstetrics and Gynecology New Patient Evaluation  Appointment Date: 03/18/2023  OBGYN Clinic: [facility]  Primary Care Provider: Shon Hale  Chief Complaint: Annual exam  History of Present Illness: Mary Cortez is a 37 y.o.  [phone] (Patient's last menstrual period was 02/20/2023.), seen for the above chief complaint. Her past medical history is significant for h/o right cornual ectopic s/p resection, h/o c-section, BMI 30s, ?transient HTN, FHx of breast and ovarian cancer.   Patient started on Yaz for PMS in May of last year and the formulation she initially got helped with her PMS but then the new packs she got in Aug/Sept didn't help so she stopped  Review of Systems: Pertinent items noted in HPI and remainder of comprehensive ROS otherwise negative.    Patient Active Problem List   Diagnosis Date Noted   PMS (premenstrual syndrome) 02/17/2022   Family history of ovarian cancer 12/18/2019   Family history of breast cancer 12/18/2019   Transient hypertension 12/18/2019   History of genetic counseling     Past Medical History:  Past Medical History:  Diagnosis Date   Anemia    Ectopic pregnancy    right. treated with multidose MTx   History of ectopic pregnancy 12/23/2018    Past Surgical History:  Past Surgical History:  Procedure Laterality Date   CESAREAN SECTION     CYSTECTOMY     DIAGNOSTIC LAPAROSCOPY WITH REMOVAL OF ECTOPIC PREGNANCY N/A 12/23/2018   Procedure: DIAGNOSTIC LAPAROSCOPY WITH REMOVAL OF ECTOPIC PREGNANCY;  Surgeon: [place] Bing, MD;  Location: [facility] OR;  Service: Gynecology;  Laterality: N/A;   LAPAROTOMY N/A 12/23/2018   Procedure: Exploratory Laparotomy for removal of right corneal ectopic pregnancy;  Surgeon: [place] Bing, MD;  Location: [facility] OR;  Service: Gynecology;  Laterality: N/A;   TONSILLECTOMY      Past Obstetrical History:  OB History  Gravida Para Term Preterm AB Living  4 1 1   3 1    SAB IAB Ectopic Multiple Live Births  1   2   1     # Outcome Date GA Lbr Len/2nd Weight Sex Delivery Anes PTL Lv  4 Ectopic 12/23/18 [id number]         3 Term 01/2017     CS-LTranv   LIV  2 Ectopic 2016          1 SAB 2007           Past Gynecological History: As per HPI. Periods: qmonth, regular, <1wk, not heavy or painful but she does get HAs during her period in addition to whole body aches History of Pap Smear(s): Yes.   Last pap 12/2019, which was ascus/hpv neg She is currently using no method for contraception.   Social History:  Social History   Socioeconomic History   Marital status: Significant Other    Spouse name: Not on file   Number of children: Not on file   Years of education: Not on file   Highest education level: Not on file  Occupational History   Not on file  Tobacco Use   Smoking status: Never   Smokeless tobacco: Never  Vaping Use   Vaping Use: Never used  Substance and Sexual Activity   Alcohol use: Yes    Comment: occ   Drug use: No   Sexual activity: Yes    Birth control/protection: None  Other Topics Concern   Not on file  Social History Narrative   Not on file  Social Determinants of Health   Financial Resource Strain: Not on file  Food Insecurity: No Food Insecurity (03/18/2023)   Hunger Vital Sign    Worried About Running Out of Food in the Last Year: Never true    Ran Out of Food in the Last Year: Never true  Transportation Needs: No Transportation Needs (03/18/2023)   PRAPARE - Administrator, Civil Service (Medical): No    Lack of Transportation (Non-Medical): No  Physical Activity: Not on file  Stress: Not on file  Social Connections: Not on file  Intimate Partner Violence: Not on file    Family History: breast and ovarian cancer  Medications [name] had no medications administered during this visit. Current Outpatient Medications  Medication Sig Dispense Refill   Cod Liver Oil CAPS 1 capsule Orally once a  day     lidocaine (LIDODERM) 5 % Apply patch to painful area. Patch may remain in place for up to 12 hours in a 24 hour period.     Multiple Vitamins-Minerals (WOMENS MULTIVITAMIN) TABS 1 tablet Orally once a day     Prenatal MV-Min-Fe Fum-FA-DHA (PRENATAL 1 PO) 1 tablet Orally Once a day     drospirenone-ethinyl estradiol (YAZ) 3-0.02 MG tablet Take 1 tablet by mouth daily. (Patient not taking: Reported on 03/18/2023) 28 tablet 11   meloxicam (MOBIC) 15 MG tablet Take 15 mg by mouth daily as needed. (Patient not taking: Reported on 03/18/2023)     tiZANidine (ZANAFLEX) 4 MG tablet Take 4 mg by mouth every 6 (six) hours as needed. (Patient not taking: Reported on 03/18/2023)     No current facility-administered medications for this visit.    Allergies Sulfamethoxazole-trimethoprim   Physical Exam:  BP 123/87   Pulse 81   Ht 5' 3.25" (1.607 m)   Wt 215 lb 14.4 oz (97.9 kg)   LMP 02/20/2023 Comment: lasted 5 days  Breastfeeding No   BMI 37.94 kg/m  Body mass index is 37.94 kg/m. General appearance: Well nourished, well developed female in no acute distress.  Neck:  Supple, normal appearance, and no thyromegaly  Cardiovascular: normal s1 and s2.  No murmurs, rubs or gallops. Respiratory:  Clear to auscultation bilateral. Normal respiratory effort Abdomen: positive bowel sounds and no masses, hernias; diffusely non tender to palpation, non distended Breasts: breasts appear normal, no suspicious masses, no skin or nipple changes or axillary nodes, normal palpation. Neuro/Psych:  Normal mood and affect.  Skin:  Warm and dry.  Lymphatic:  No inguinal lymphadenopathy.   Cervical exam performed in the presence of a chaperone Pelvic exam: is not limited by body habitus EGBUS: within normal limits Vagina: within normal limits and with no blood or discharge in the vault Cervix: normal appearing cervix without tenderness, discharge or lesions. Uterus:  nonenlarged and non tender Adnexa:   normal adnexa and no mass, fullness, tenderness Rectovaginal: deferred  Laboratory: none  Radiology: none  Assessment: patient stable  Plan:  1. Encounter for annual routine gynecological examination Patient desires STD screening, too.  Repeat genetic counseling offered but patient okay with counseling she has already had  2. History of abnormal cervical Pap smear Pap today  3. Transient hypertension See below  4. Encounter for other general counseling or advice on contraception See below  5. PMS Patient wondering about tubal ligation. I d/w her that a tubal would not help with her PMS s/s and only work for contraception. I told her I recommend some type of systemic  hormonal option to help. I recommend trying Slynd, which she is amenable to, but if it is not covered by her insurance, we can see if we have to do a PA to get it covered. If it's still not covered, I told her to take a picture of the Yaz pack that helped and we can see if the pharmacy will order this particular type. If have to use Yaz, I told her that I recommend a BP check in about a month since it has VTE and HTN risk associated with it. Patient amenable to plan  RTC PRN  Return in about 6 weeks (around 04/29/2023) for in person, bp check, rn visit.   Cornelia Copa MD Attending Center for [employer] Midwife)

## 2023-03-19 ENCOUNTER — Encounter: Payer: Self-pay | Admitting: Obstetrics and Gynecology

## 2023-03-19 LAB — RPR+HBSAG+HCVAB+...
HIV Screen 4th Generation wRfx: NONREACTIVE
Hep C Virus Ab: NONREACTIVE
Hepatitis B Surface Ag: NEGATIVE
RPR Ser Ql: NONREACTIVE

## 2023-03-22 NOTE — BH Specialist Note (Signed)
Integrated Behavioral Health via Telemedicine Visit  04/05/2023 [name] [id number]  Number of Integrated Behavioral Health Clinician visits: 1- Initial Visit  Session Start time: 0920   Session End time: 1020  Total time in minutes: 60   Referring Provider: Lucerne Bing, MD Patient/Family location: Home [facility] Provider location: [facility]  All persons participating in visit: Patient Mary Cortez and [facility] Mary Cortez   Types of Service: Individual psychotherapy and Video visit  I connected with Mary Cortez and/or Mary Cortez's  n/a  via  Telephone or Video Enabled Telemedicine Application  (Video is Caregility application) and verified that I am speaking with the correct person using two identifiers. Discussed confidentiality: Yes   I discussed the limitations of telemedicine and the availability of in person appointments.  Discussed there is a possibility of technology failure and discussed alternative modes of communication if that failure occurs.  I discussed that engaging in this telemedicine visit, they consent to the provision of behavioral healthcare and the services will be billed under their insurance.  Patient and/or legal guardian expressed understanding and consented to Telemedicine visit: Yes   Presenting Concerns: Patient and/or family reports the following symptoms/concerns: Increasing anxiety and depression, attributes to current life stress and feeling stagnant;  pt's goals are to be able to make the best decisions moving forward to maintain overall wellness and work-life balance, while prioritizing both her own and daughter's needs.  Duration of problem: Increasing over time; Severity of problem:  moderately severe  Patient and/or Family's Strengths/Protective Factors: Concrete supports in place (healthy food, safe environments, etc.) and Sense of purpose  Goals Addressed: Patient  will:  Reduce symptoms of: anxiety, depression, and stress   Increase knowledge and/or ability of: coping skills   Demonstrate ability to: Increase healthy adjustment to current life circumstances and Increase adequate support systems for patient/family  Progress towards Goals: Ongoing  Interventions: Interventions utilized:  CBT Cognitive Behavioral Therapy and Supportive Reflection Standardized Assessments completed: Not Needed  Patient and/or Family Response: Patient agrees with treatment plan.   Assessment: Patient currently experiencing Adjustment disorder with mixed anxious and depressed mood; Psychosocial stress.   Patient may benefit from psychoeducation and brief therapeutic interventions regarding coping with symptoms of anxiety, depression; life stress .  Plan: Follow up with behavioral health clinician on : Two weeks Behavioral recommendations:  -Continue prioritizing healthy self-care habits (journaling, etc., as discussed) -Begin Worry Time strategy, as discussed. Start by setting up start and end time reminders on phone today; continue daily for two weeks. Referral(s): Integrated Art gallery manager (In Clinic) and [employer]:  Business  I discussed the assessment and treatment plan with the patient and/or parent/guardian. They were provided an opportunity to ask questions and all were answered. They agreed with the plan and demonstrated an understanding of the instructions.   They were advised to call back or seek an in-person evaluation if the symptoms worsen or if the condition fails to improve as anticipated.  Rae Lips, LCSW     03/18/2023    8:55 AM 02/17/2022    4:53 PM 12/18/2019    9:55 AM  Depression screen PHQ 2/9  Decreased Interest 2 1 1   Down, Depressed, Hopeless 2 0 0  PHQ - 2 Score 4 1 1   Altered sleeping 3 2 0  Tired, decreased energy 1 2 1   Change in appetite 3 2 0  Feeling bad or failure about yourself  2 0 0  Trouble  concentrating 1 0 1  Moving slowly or fidgety/restless 0 2 0  Suicidal thoughts 0 0 0  PHQ-9 Score 14 9 3   Difficult doing work/chores   Not difficult at all      03/18/2023    8:55 AM 02/17/2022    4:53 PM 12/18/2019    9:55 AM  GAD 7 : Generalized Anxiety Score  Nervous, Anxious, on Edge 3 3 1   Control/stop worrying 2 1 0  Worry too much - different things 3 2 1   Trouble relaxing 1 2 0  Restless 2 2 0  Easily annoyed or irritable 2 0 0  Afraid - awful might happen 3 1 1   Total GAD 7 Score 16 11 3

## 2023-03-24 LAB — CYTOLOGY - PAP
Chlamydia: NEGATIVE
Comment: NEGATIVE
Comment: NEGATIVE
Comment: NEGATIVE
Comment: NORMAL
Diagnosis: NEGATIVE
Diagnosis: REACTIVE
High risk HPV: NEGATIVE
Neisseria Gonorrhea: NEGATIVE
Trichomonas: NEGATIVE

## 2023-03-29 ENCOUNTER — Telehealth: Payer: Self-pay | Admitting: Lactation Services

## 2023-03-29 NOTE — Telephone Encounter (Signed)
Called Pharmacy to inform of approval of Slynd prescription. Prescription was able to go thru. Will send My Chart message to patient.

## 2023-04-05 ENCOUNTER — Ambulatory Visit: Payer: Self-pay | Admitting: Clinical

## 2023-04-05 DIAGNOSIS — Z658 Other specified problems related to psychosocial circumstances: Secondary | ICD-10-CM

## 2023-04-05 DIAGNOSIS — F4323 Adjustment disorder with mixed anxiety and depressed mood: Secondary | ICD-10-CM

## 2023-04-05 DIAGNOSIS — J0100 Acute maxillary sinusitis, unspecified: Secondary | ICD-10-CM | POA: Diagnosis not present

## 2023-04-05 NOTE — Patient Instructions (Signed)
[facility] [address] [place], [place] [address] [phone] (main office) [phone] (Thuy Atilano's office)   SCORE: For the life of your business [email or web address]   [email or web address]   

## 2023-04-19 NOTE — BH Specialist Note (Deleted)
Integrated Behavioral Health via Telemedicine Visit  04/19/2023 CAROLE KRILL [id number]  Number of Integrated Behavioral Health Clinician visits: 1- Initial Visit  Session Start time: 0920   Session End time: 1020  Total time in minutes: 60   Referring Provider: *** Patient/Family location: [facility] Provider location: *** All persons participating in visit: *** Types of Service: {CHL AMB TYPE OF SERVICE:[phone]}  I connected with Jayleigh D Rockhold and/or Kaylamarie D Knecht's {family members:20773} via  Telephone or Video Enabled Telemedicine Application  (Video is Caregility application) and verified that I am speaking with the correct person using two identifiers. Discussed confidentiality: {YES/NO:21197}  I discussed the limitations of telemedicine and the availability of in person appointments.  Discussed there is a possibility of technology failure and discussed alternative modes of communication if that failure occurs.  I discussed that engaging in this telemedicine visit, they consent to the provision of behavioral healthcare and the services will be billed under their insurance.  Patient and/or legal guardian expressed understanding and consented to Telemedicine visit: {YES/NO:21197}  Presenting Concerns: Patient and/or family reports the following symptoms/concerns: *** Duration of problem: ***; Severity of problem: {Mild/Moderate/Severe:20260}  Patient and/or Family's Strengths/Protective Factors: {CHL AMB BH PROTECTIVE FACTORS:[phone]}  Goals Addressed: Patient will:  Reduce symptoms of: {IBH Symptoms:21014056}   Increase knowledge and/or ability of: {IBH Patient Tools:21014057}   Demonstrate ability to: {IBH Goals:21014053}  Progress towards Goals: {CHL AMB BH PROGRESS TOWARDS GOALS:[phone]}  Interventions: Interventions utilized:  {IBH Interventions:21014054} Standardized Assessments completed: {IBH Screening Tools:21014051}  Patient and/or Family  Response: ***  Assessment: Patient currently experiencing ***.   Patient may benefit from ***.  Plan: Follow up with behavioral health clinician on : *** Behavioral recommendations: *** Referral(s): {IBH Referrals:21014055}  I discussed the assessment and treatment plan with the patient and/or parent/guardian. They were provided an opportunity to ask questions and all were answered. They agreed with the plan and demonstrated an understanding of the instructions.   They were advised to call back or seek an in-person evaluation if the symptoms worsen or if the condition fails to improve as anticipated.  Valetta Close Joeleen Wortley, LCSW

## 2023-05-13 ENCOUNTER — Ambulatory Visit: Payer: BC Managed Care – PPO | Admitting: Obstetrics and Gynecology

## 2023-10-27 DIAGNOSIS — F331 Major depressive disorder, recurrent, moderate: Secondary | ICD-10-CM | POA: Diagnosis not present

## 2023-10-27 DIAGNOSIS — F419 Anxiety disorder, unspecified: Secondary | ICD-10-CM | POA: Diagnosis not present

## 2023-11-17 DIAGNOSIS — F331 Major depressive disorder, recurrent, moderate: Secondary | ICD-10-CM | POA: Diagnosis not present

## 2023-11-17 DIAGNOSIS — F419 Anxiety disorder, unspecified: Secondary | ICD-10-CM | POA: Diagnosis not present

## 2023-12-01 DIAGNOSIS — F331 Major depressive disorder, recurrent, moderate: Secondary | ICD-10-CM | POA: Diagnosis not present

## 2023-12-01 DIAGNOSIS — F419 Anxiety disorder, unspecified: Secondary | ICD-10-CM | POA: Diagnosis not present

## 2023-12-08 DIAGNOSIS — M797 Fibromyalgia: Secondary | ICD-10-CM | POA: Diagnosis not present

## 2023-12-08 DIAGNOSIS — Z03818 Encounter for observation for suspected exposure to other biological agents ruled out: Secondary | ICD-10-CM | POA: Diagnosis not present

## 2023-12-08 DIAGNOSIS — Z0000 Encounter for general adult medical examination without abnormal findings: Secondary | ICD-10-CM | POA: Diagnosis not present

## 2023-12-08 DIAGNOSIS — Z79899 Other long term (current) drug therapy: Secondary | ICD-10-CM | POA: Diagnosis not present

## 2023-12-08 DIAGNOSIS — R0981 Nasal congestion: Secondary | ICD-10-CM | POA: Diagnosis not present

## 2023-12-08 DIAGNOSIS — E7800 Pure hypercholesterolemia, unspecified: Secondary | ICD-10-CM | POA: Diagnosis not present

## 2023-12-16 DIAGNOSIS — F419 Anxiety disorder, unspecified: Secondary | ICD-10-CM | POA: Diagnosis not present

## 2023-12-16 DIAGNOSIS — F331 Major depressive disorder, recurrent, moderate: Secondary | ICD-10-CM | POA: Diagnosis not present

## 2023-12-22 DIAGNOSIS — F419 Anxiety disorder, unspecified: Secondary | ICD-10-CM | POA: Diagnosis not present

## 2023-12-22 DIAGNOSIS — F331 Major depressive disorder, recurrent, moderate: Secondary | ICD-10-CM | POA: Diagnosis not present

## 2024-01-26 DIAGNOSIS — F419 Anxiety disorder, unspecified: Secondary | ICD-10-CM | POA: Diagnosis not present

## 2024-01-26 DIAGNOSIS — F331 Major depressive disorder, recurrent, moderate: Secondary | ICD-10-CM | POA: Diagnosis not present

## 2024-02-09 DIAGNOSIS — F419 Anxiety disorder, unspecified: Secondary | ICD-10-CM | POA: Diagnosis not present

## 2024-02-09 DIAGNOSIS — F331 Major depressive disorder, recurrent, moderate: Secondary | ICD-10-CM | POA: Diagnosis not present

## 2024-02-15 DIAGNOSIS — F331 Major depressive disorder, recurrent, moderate: Secondary | ICD-10-CM | POA: Diagnosis not present

## 2024-03-08 DIAGNOSIS — F419 Anxiety disorder, unspecified: Secondary | ICD-10-CM | POA: Diagnosis not present

## 2024-03-08 DIAGNOSIS — F331 Major depressive disorder, recurrent, moderate: Secondary | ICD-10-CM | POA: Diagnosis not present

## 2024-03-22 DIAGNOSIS — F331 Major depressive disorder, recurrent, moderate: Secondary | ICD-10-CM | POA: Diagnosis not present

## 2024-03-22 DIAGNOSIS — F419 Anxiety disorder, unspecified: Secondary | ICD-10-CM | POA: Diagnosis not present

## 2024-04-14 DIAGNOSIS — R1030 Lower abdominal pain, unspecified: Secondary | ICD-10-CM | POA: Diagnosis not present

## 2024-04-14 DIAGNOSIS — R350 Frequency of micturition: Secondary | ICD-10-CM | POA: Diagnosis not present

## 2024-05-16 DIAGNOSIS — M79605 Pain in left leg: Secondary | ICD-10-CM | POA: Diagnosis not present

## 2024-05-16 DIAGNOSIS — M5441 Lumbago with sciatica, right side: Secondary | ICD-10-CM | POA: Diagnosis not present

## 2024-05-16 DIAGNOSIS — M5416 Radiculopathy, lumbar region: Secondary | ICD-10-CM | POA: Diagnosis not present

## 2024-05-16 DIAGNOSIS — M79604 Pain in right leg: Secondary | ICD-10-CM | POA: Diagnosis not present

## 2024-05-16 DIAGNOSIS — M5442 Lumbago with sciatica, left side: Secondary | ICD-10-CM | POA: Diagnosis not present

## 2024-05-19 DIAGNOSIS — M79605 Pain in left leg: Secondary | ICD-10-CM | POA: Diagnosis not present

## 2024-06-02 DIAGNOSIS — U071 COVID-19: Secondary | ICD-10-CM | POA: Diagnosis not present

## 2024-07-12 DIAGNOSIS — F419 Anxiety disorder, unspecified: Secondary | ICD-10-CM | POA: Diagnosis not present

## 2024-07-12 DIAGNOSIS — F331 Major depressive disorder, recurrent, moderate: Secondary | ICD-10-CM | POA: Diagnosis not present

## 2024-07-22 DIAGNOSIS — R300 Dysuria: Secondary | ICD-10-CM | POA: Diagnosis not present

## 2024-07-22 DIAGNOSIS — N3001 Acute cystitis with hematuria: Secondary | ICD-10-CM | POA: Diagnosis not present

## 2024-08-16 DIAGNOSIS — F419 Anxiety disorder, unspecified: Secondary | ICD-10-CM | POA: Diagnosis not present

## 2024-08-16 DIAGNOSIS — F331 Major depressive disorder, recurrent, moderate: Secondary | ICD-10-CM | POA: Diagnosis not present
